# Patient Record
Sex: Female | Born: 1950 | Race: White | Hispanic: No | Marital: Married | Smoking: Never smoker
Health system: Southern US, Community
[De-identification: ages and names within clinical notes are randomized; demographics above are authoritative.]

## PROBLEM LIST (undated history)

## (undated) DIAGNOSIS — Q600 Renal agenesis, unilateral: Secondary | ICD-10-CM

## (undated) DIAGNOSIS — E119 Type 2 diabetes mellitus without complications: Secondary | ICD-10-CM

## (undated) DIAGNOSIS — Z8669 Personal history of other diseases of the nervous system and sense organs: Secondary | ICD-10-CM

## (undated) DIAGNOSIS — M1990 Unspecified osteoarthritis, unspecified site: Secondary | ICD-10-CM

## (undated) DIAGNOSIS — I10 Essential (primary) hypertension: Secondary | ICD-10-CM

## (undated) DIAGNOSIS — IMO0002 Reserved for concepts with insufficient information to code with codable children: Secondary | ICD-10-CM

## (undated) DIAGNOSIS — B372 Candidiasis of skin and nail: Secondary | ICD-10-CM

## (undated) DIAGNOSIS — K589 Irritable bowel syndrome without diarrhea: Secondary | ICD-10-CM

## (undated) DIAGNOSIS — K219 Gastro-esophageal reflux disease without esophagitis: Secondary | ICD-10-CM

## (undated) DIAGNOSIS — E785 Hyperlipidemia, unspecified: Secondary | ICD-10-CM

## (undated) DIAGNOSIS — Z860100 Personal history of colon polyps, unspecified: Secondary | ICD-10-CM

## (undated) HISTORY — DX: Unspecified osteoarthritis, unspecified site: M19.90

## (undated) HISTORY — PX: ABDOMINAL HYSTERECTOMY: SHX81

## (undated) HISTORY — DX: Candidiasis of skin and nail: B37.2

## (undated) HISTORY — DX: Gastro-esophageal reflux disease without esophagitis: K21.9

## (undated) HISTORY — DX: Irritable bowel syndrome, unspecified: K58.9

## (undated) HISTORY — DX: Type 2 diabetes mellitus without complications: E11.9

## (undated) HISTORY — DX: Renal agenesis, unilateral: Q60.0

## (undated) HISTORY — DX: Personal history of other diseases of the nervous system and sense organs: Z86.69

## (undated) HISTORY — DX: Hyperlipidemia, unspecified: E78.5

## (undated) HISTORY — DX: Essential (primary) hypertension: I10

## (undated) HISTORY — PX: NASAL SINUS SURGERY: SHX719

## (undated) HISTORY — DX: Personal history of colonic polyps: Z86.010

## (undated) HISTORY — PX: SALPINGOOPHORECTOMY: SHX82

## (undated) HISTORY — DX: Reserved for concepts with insufficient information to code with codable children: IMO0002

## (undated) HISTORY — DX: Personal history of colon polyps, unspecified: Z86.0100

---

## 2004-09-24 LAB — HM COLONOSCOPY

## 2010-09-28 LAB — HM PAP SMEAR

## 2011-03-07 LAB — HM MAMMOGRAPHY

## 2012-09-11 ENCOUNTER — Telehealth: Payer: Self-pay | Admitting: Family Medicine

## 2012-09-26 ENCOUNTER — Other Ambulatory Visit: Payer: Self-pay

## 2012-10-02 ENCOUNTER — Ambulatory Visit: Payer: Self-pay | Admitting: Family Medicine

## 2012-10-04 ENCOUNTER — Encounter: Payer: Self-pay | Admitting: Family Medicine

## 2012-10-04 ENCOUNTER — Ambulatory Visit (HOSPITAL_BASED_OUTPATIENT_CLINIC_OR_DEPARTMENT_OTHER)
Admission: RE | Admit: 2012-10-04 | Discharge: 2012-10-04 | Disposition: A | Discharge status: Home / Self Care | Payer: Medicare Other | Source: Ambulatory Visit | Attending: Family Medicine | Admitting: Family Medicine

## 2012-10-04 ENCOUNTER — Ambulatory Visit (INDEPENDENT_AMBULATORY_CARE_PROVIDER_SITE_OTHER): Payer: Medicare Other | Admitting: Family Medicine

## 2012-10-04 VITALS — BP 101/68 | HR 67 | Resp 16 | Ht 62.0 in | Wt 198.0 lb

## 2012-10-04 DIAGNOSIS — R05 Cough: Secondary | ICD-10-CM

## 2012-10-04 DIAGNOSIS — R0603 Acute respiratory distress: Secondary | ICD-10-CM

## 2012-10-04 DIAGNOSIS — R062 Wheezing: Secondary | ICD-10-CM

## 2012-10-04 DIAGNOSIS — R059 Cough, unspecified: Secondary | ICD-10-CM

## 2012-10-04 DIAGNOSIS — R0989 Other specified symptoms and signs involving the circulatory and respiratory systems: Secondary | ICD-10-CM | POA: Insufficient documentation

## 2012-10-04 DIAGNOSIS — R5081 Fever presenting with conditions classified elsewhere: Secondary | ICD-10-CM

## 2012-10-04 DIAGNOSIS — R509 Fever, unspecified: Secondary | ICD-10-CM | POA: Insufficient documentation

## 2012-10-04 MED ORDER — BENZONATATE 200 MG PO CAPS: 200.0000 mg | ORAL_CAPSULE | Freq: Three times a day (TID) | ORAL | Status: DC | PRN

## 2012-10-04 MED ORDER — ALBUTEROL SULFATE HFA 108 (90 BASE) MCG/ACT IN AERS: 2.0000 | INHALATION_SPRAY | Freq: Four times a day (QID) | RESPIRATORY_TRACT | Status: DC | PRN

## 2012-10-04 MED ORDER — HYDROCODONE-HOMATROPINE 5-1.5 MG/5ML PO SYRP: ORAL_SOLUTION | ORAL | Status: DC

## 2012-10-04 MED ORDER — AZITHROMYCIN 500 MG PO TABS: 500.0000 mg | ORAL_TABLET | Freq: Every day | ORAL | Status: AC

## 2012-10-04 NOTE — Progress Notes (Signed)
  Subjective:    Patient ID: Adrienne Mcconnell, female    DOB: May 14, 1951, 62 y.o.   MRN: [id number]  HPI:    Adrienne Mcconnell presents today for:   URI  This is a recurrent problem. The current episode started more than 1 month ago. The problem has been gradually worsening. The maximum temperature recorded prior to her arrival was 100 - 100.9 F. Associated symptoms include congestion, coughing, ear pain, headaches and joint pain. Associated symptoms comments: Difficulty breathing. She has tried decongestant for the symptoms. The treatment provided no relief.   Review of Systems  HENT: Positive for ear pain and congestion.   Respiratory: Positive for cough.   Musculoskeletal: Positive for joint pain.  Neurological: Positive for headaches.      Objective:   Physical Exam  Constitutional: She appears well-nourished. No distress.  Cardiovascular: Normal rate, regular rhythm and normal heart sounds.   Pulmonary/Chest: Effort normal. No respiratory distress. She has wheezes. She exhibits tenderness.  Abdominal: She exhibits no mass. There is tenderness. There is no rebound, no guarding and no CVA tenderness.  Neurological: She is alert.  Skin: Skin is warm and dry. No rash noted.  Psychiatric: She has a normal mood and affect.      Assessment & Plan:   1)  Upper Respiratory Infection - Her CXR was normal.  Since she has been sick for a month without much improvement and still has some elevation of her temperature, she was given a prescription for Zithromax to take for 3 days.  2)  Wheezing - Her breathing improved after she received a breathing treatment so she was given a prescription for an albuterol inhaler.    3)  Cough - She was given medications for her cough.

## 2012-10-04 NOTE — Patient Instructions (Addendum)
1)  Take Zithromax once a day for 3 days  2)  Delsym 2 tsp 2 times per day or Mucinex DM 1200/60 twice a day for cough plus add the Tessalon Perles and the Hycodan at night.    3)  Nasal Congestion - Noral AD   4)  Chest Congestion/Tightness - Use the inhaler as directed.     Bronchitis Bronchitis is the body's way of reacting to injury and/or infection (inflammation) of the bronchi. Bronchi are the air tubes that extend from the windpipe into the lungs. If the inflammation becomes severe, it may cause shortness of breath. CAUSES  Inflammation may be caused by:  A virus.  Germs (bacteria).  Dust.  Allergens.  Pollutants and many other irritants. The cells lining the bronchial tree are covered with tiny hairs (cilia). These constantly beat upward, away from the lungs, toward the mouth. This keeps the lungs free of pollutants. When these cells become too irritated and are unable to do their job, mucus begins to develop. This causes the characteristic cough of bronchitis. The cough clears the lungs when the cilia are unable to do their job. Without either of these protective mechanisms, the mucus would settle in the lungs. Then you would develop pneumonia. Smoking is a common cause of bronchitis and can contribute to pneumonia. Stopping this habit is the single most important thing you can do to help yourself. TREATMENT   Your caregiver may prescribe an antibiotic if the cough is caused by bacteria. Also, medicines that open up your airways make it easier to breathe. Your caregiver may also recommend or prescribe an expectorant. It will loosen the mucus to be coughed up. Only take over-the-counter or prescription medicines for pain, discomfort, or fever as directed by your caregiver.  Removing whatever causes the problem (smoking, for example) is critical to preventing the problem from getting worse.  Cough suppressants may be prescribed for relief of cough symptoms.  Inhaled medicines  may be prescribed to help with symptoms now and to help prevent problems from returning.  For those with recurrent (chronic) bronchitis, there may be a need for steroid medicines. SEEK IMMEDIATE MEDICAL CARE IF:   During treatment, you develop more pus-like mucus (purulent sputum).  You have a fever.  Your baby is older than 3 months with a rectal temperature of 102 F (38.9 C) or higher.  Your baby is 19 months old or younger with a rectal temperature of 100.4 F (38 C) or higher.  You become progressively more ill.  You have increased difficulty breathing, wheezing, or shortness of breath. It is necessary to seek immediate medical care if you are elderly or sick from any other disease. MAKE SURE YOU:   Understand these instructions.  Will watch your condition.  Will get help right away if you are not doing well or get worse. Document Released: 05/23/2005 Document Revised: 08/15/2011 Document Reviewed: 04/01/2008 [facility] Patient Information 2013 [place], [place].

## 2012-10-05 ENCOUNTER — Encounter: Payer: Self-pay | Admitting: Family Medicine

## 2012-10-07 ENCOUNTER — Encounter: Payer: Self-pay | Admitting: Family Medicine

## 2012-10-07 DIAGNOSIS — R5081 Fever presenting with conditions classified elsewhere: Secondary | ICD-10-CM | POA: Insufficient documentation

## 2012-10-07 DIAGNOSIS — R062 Wheezing: Secondary | ICD-10-CM | POA: Insufficient documentation

## 2012-10-07 DIAGNOSIS — R059 Cough, unspecified: Secondary | ICD-10-CM | POA: Insufficient documentation

## 2012-10-07 DIAGNOSIS — R05 Cough: Secondary | ICD-10-CM | POA: Insufficient documentation

## 2012-10-07 NOTE — Addendum Note (Signed)
Addended by: Birdena Jubilee on: 10/07/2012 03:08 PM   Modules accepted: Orders

## 2012-10-08 ENCOUNTER — Other Ambulatory Visit: Payer: Self-pay | Admitting: Family Medicine

## 2012-11-09 ENCOUNTER — Other Ambulatory Visit: Payer: Medicare Other

## 2012-11-09 DIAGNOSIS — E1059 Type 1 diabetes mellitus with other circulatory complications: Secondary | ICD-10-CM

## 2012-11-09 DIAGNOSIS — E785 Hyperlipidemia, unspecified: Secondary | ICD-10-CM

## 2012-11-09 DIAGNOSIS — E039 Hypothyroidism, unspecified: Secondary | ICD-10-CM

## 2012-11-09 LAB — CBC WITH DIFFERENTIAL/PLATELET
Basophils Absolute: 0 10*3/uL (ref 0.0–0.1)
Basophils Relative: 1 % (ref 0–1)
Eosinophils Absolute: 0.1 10*3/uL (ref 0.0–0.7)
Eosinophils Relative: 1 % (ref 0–5)
HCT: 38.3 % (ref 36.0–46.0)
Hemoglobin: 13 g/dL (ref 12.0–15.0)
Lymphocytes Relative: 37 % (ref 12–46)
Lymphs Abs: 2.4 10*3/uL (ref 0.7–4.0)
MCH: 32 pg (ref 26.0–34.0)
MCHC: 33.9 g/dL (ref 30.0–36.0)
MCV: 94.3 fL (ref 78.0–100.0)
Monocytes Absolute: 0.6 10*3/uL (ref 0.1–1.0)
Monocytes Relative: 10 % (ref 3–12)
Neutro Abs: 3.3 10*3/uL (ref 1.7–7.7)
Neutrophils Relative %: 51 % (ref 43–77)
Platelets: 258 10*3/uL (ref 150–400)
RBC: 4.06 MIL/uL (ref 3.87–5.11)
RDW: 14.5 % (ref 11.5–15.5)
WBC: 6.5 10*3/uL (ref 4.0–10.5)

## 2012-11-09 LAB — COMPLETE METABOLIC PANEL WITH GFR
ALT: 15 U/L (ref 0–35)
AST: 14 U/L (ref 0–37)
Albumin: 4.3 g/dL (ref 3.5–5.2)
Alkaline Phosphatase: 91 U/L (ref 39–117)
BUN: 13 mg/dL (ref 6–23)
CO2: 28 mEq/L (ref 19–32)
Calcium: 9.7 mg/dL (ref 8.4–10.5)
Chloride: 104 mEq/L (ref 96–112)
Creat: 0.94 mg/dL (ref 0.50–1.10)
GFR, Est African American: 77 mL/min
GFR, Est Non African American: 67 mL/min
Glucose, Bld: 123 mg/dL — ABNORMAL HIGH (ref 70–99)
Potassium: 5 mEq/L (ref 3.5–5.3)
Sodium: 138 mEq/L (ref 135–145)
Total Bilirubin: 0.4 mg/dL (ref 0.3–1.2)
Total Protein: 6.9 g/dL (ref 6.0–8.3)

## 2012-11-09 LAB — LIPID PANEL
Cholesterol: 214 mg/dL — ABNORMAL HIGH (ref 0–200)
HDL: 74 mg/dL (ref >39)
LDL Cholesterol: 107 mg/dL — ABNORMAL HIGH (ref 0–99)
Total CHOL/HDL Ratio: 2.9 Ratio
Triglycerides: 165 mg/dL — ABNORMAL HIGH (ref <150)
VLDL: 33 mg/dL (ref 0–40)

## 2012-11-09 LAB — HEMOGLOBIN A1C
Hgb A1c MFr Bld: 6.1 % — ABNORMAL HIGH (ref <5.7)
Mean Plasma Glucose: 128 mg/dL — ABNORMAL HIGH (ref <117)

## 2012-11-09 LAB — TSH: TSH: 1.002 u[IU]/mL (ref 0.350–4.500)

## 2012-11-09 LAB — T4, FREE: Free T4: 1.03 ng/dL (ref 0.80–1.80)

## 2012-11-16 ENCOUNTER — Encounter: Payer: Self-pay | Admitting: Family Medicine

## 2012-11-16 ENCOUNTER — Ambulatory Visit (INDEPENDENT_AMBULATORY_CARE_PROVIDER_SITE_OTHER): Payer: Medicare Other | Admitting: Family Medicine

## 2012-11-16 VITALS — BP 96/67 | HR 64 | Wt 198.0 lb

## 2012-11-16 DIAGNOSIS — E039 Hypothyroidism, unspecified: Secondary | ICD-10-CM

## 2012-11-16 DIAGNOSIS — E785 Hyperlipidemia, unspecified: Secondary | ICD-10-CM

## 2012-11-16 DIAGNOSIS — IMO0001 Reserved for inherently not codable concepts without codable children: Secondary | ICD-10-CM

## 2012-11-16 DIAGNOSIS — I10 Essential (primary) hypertension: Secondary | ICD-10-CM

## 2012-11-16 DIAGNOSIS — K219 Gastro-esophageal reflux disease without esophagitis: Secondary | ICD-10-CM

## 2012-11-16 MED ORDER — PRAVASTATIN SODIUM 40 MG PO TABS: 40.0000 mg | ORAL_TABLET | Freq: Every day | ORAL | Status: DC

## 2012-11-16 MED ORDER — SITAGLIPTIN PHOSPHATE 100 MG PO TABS: 100.0000 mg | ORAL_TABLET | Freq: Every day | ORAL | Status: DC

## 2012-11-16 MED ORDER — PANTOPRAZOLE SODIUM 40 MG PO TBEC: 40.0000 mg | DELAYED_RELEASE_TABLET | Freq: Every day | ORAL | Status: AC

## 2012-11-16 MED ORDER — MONTELUKAST SODIUM 10 MG PO TABS: 10.0000 mg | ORAL_TABLET | Freq: Every day | ORAL | Status: DC

## 2012-11-16 MED ORDER — NATEGLINIDE 120 MG PO TABS: 120.0000 mg | ORAL_TABLET | Freq: Three times a day (TID) | ORAL | Status: DC

## 2012-11-16 MED ORDER — LEVOTHYROXINE SODIUM 137 MCG PO TABS: 137.0000 ug | ORAL_TABLET | Freq: Every day | ORAL | Status: DC

## 2012-11-16 MED ORDER — CANAGLIFLOZIN 300 MG PO TABS: 1.0000 | ORAL_TABLET | Freq: Every day | ORAL | Status: DC

## 2012-11-16 MED ORDER — NADOLOL 40 MG PO TABS: ORAL_TABLET | ORAL | Status: DC

## 2012-11-16 NOTE — Progress Notes (Signed)
  Subjective:    Patient ID: Adrienne Mcconnell, female    DOB: 12-21-1950, 62 y.o.   MRN: [id number]  HPI  Adrienne Mcconnell is here today to go over her most recent lab results, discuss the following conditions and to have several of her medications refilled.   1)  Type II DM:  Her sugars are controlled on the combination of Invokana, Januvia and Starlix  2)  GERD:  Her GERD is controlled on Protonix.  3)  Hypothyroid:  Her energy level is low but this is not unusual for her    4)  Hypertension:  Her BP is low on her current medication  Review of Systems  Constitutional: Positive for fatigue. Negative for activity change, appetite change and unexpected weight change.  HENT: Negative.   Eyes: Negative.   Respiratory: Negative for chest tightness and shortness of breath.   Cardiovascular: Negative for chest pain, palpitations and leg swelling.  Gastrointestinal: Negative for diarrhea and constipation.  Endocrine: Negative.  Negative for polydipsia, polyphagia and polyuria.  Genitourinary: Negative for urgency, frequency and difficulty urinating.  Musculoskeletal: Positive for myalgias and arthralgias.  Skin: Negative.   Neurological: Negative.  Negative for light-headedness and numbness.  Hematological: Negative for adenopathy. Does not bruise/bleed easily.  Psychiatric/Behavioral: Negative for sleep disturbance and dysphoric mood. The patient is not nervous/anxious.    Past Medical History  Diagnosis Date  . Hyperlipidemia   . Hypertension   . GERD (gastroesophageal reflux disease)   . Osteoarthritis     Neck  . IBS (irritable bowel syndrome)   . History of migraine headaches   . Diabetes mellitus without complication   . History of colon polyps   . Solitary kidney   . Osteoarthritis   . Candidiasis of skin and nail    Family History  Problem Relation Age of Onset  . Diabetes Mother   . Heart disease Mother   . Diabetes Father   . Stroke Father   . Diabetes Maternal Aunt   .  Diabetes Maternal Uncle   . CVA Maternal Uncle   . Diabetes Paternal Aunt   . Diabetes Paternal Uncle   . Heart disease Maternal Grandmother   . Cancer Cousin     Breast and lung cancer   History   Social History Narrative   Marital Status:  Widowed   Children:  Daughter Psychologist, counselling)    Pets: Dogs (7)    Living Situation: Lives alone    Occupation: Disabled   Education:  GED   Tobacco Use/Exposure:  None    Alcohol Use:  None    Drug Use:  None   Diet:  Regular   Exercise:  Walking    Hobbies: Reading, Animals, Photography                Objective:   Physical Exam  Constitutional: She appears well-nourished. No distress.  HENT:  Head: Normocephalic.  Eyes: No scleral icterus.  Neck: No thyromegaly present.  Cardiovascular: Normal rate, regular rhythm and normal heart sounds.   Pulmonary/Chest: Effort normal and breath sounds normal.  Abdominal: There is no tenderness.  Musculoskeletal: She exhibits no edema and no tenderness.  Neurological: She is alert.  Skin: Skin is warm and dry.  Psychiatric: She has a normal mood and affect. Her behavior is normal. Judgment and thought content normal.       Assessment & Plan:

## 2012-11-22 ENCOUNTER — Ambulatory Visit: Payer: Medicare Other | Admitting: Family Medicine

## 2012-11-22 DIAGNOSIS — Z0289 Encounter for other administrative examinations: Secondary | ICD-10-CM

## 2012-12-14 DIAGNOSIS — E039 Hypothyroidism, unspecified: Secondary | ICD-10-CM | POA: Insufficient documentation

## 2012-12-14 DIAGNOSIS — IMO0001 Reserved for inherently not codable concepts without codable children: Secondary | ICD-10-CM | POA: Insufficient documentation

## 2012-12-14 DIAGNOSIS — K219 Gastro-esophageal reflux disease without esophagitis: Secondary | ICD-10-CM | POA: Insufficient documentation

## 2012-12-14 DIAGNOSIS — I10 Essential (primary) hypertension: Secondary | ICD-10-CM | POA: Insufficient documentation

## 2012-12-14 DIAGNOSIS — E785 Hyperlipidemia, unspecified: Secondary | ICD-10-CM | POA: Insufficient documentation

## 2012-12-14 NOTE — Assessment & Plan Note (Signed)
Her BP is low on the combination of losartan and nadolol. She is to hold the losartan for now.

## 2012-12-16 NOTE — Assessment & Plan Note (Signed)
Refilled her Invokana, Januvia and Starlix

## 2012-12-16 NOTE — Assessment & Plan Note (Signed)
Refilled her Pravastatin

## 2012-12-16 NOTE — Assessment & Plan Note (Signed)
Refilled her levothyroid 137 mcg.

## 2012-12-16 NOTE — Assessment & Plan Note (Signed)
Refilled her Protonix.   

## 2013-01-23 ENCOUNTER — Ambulatory Visit (INDEPENDENT_AMBULATORY_CARE_PROVIDER_SITE_OTHER): Payer: Medicare Other | Admitting: Family Medicine

## 2013-01-23 ENCOUNTER — Encounter: Payer: Self-pay | Admitting: Family Medicine

## 2013-01-23 VITALS — BP 128/82 | HR 76 | Resp 16 | Wt 198.0 lb

## 2013-01-23 DIAGNOSIS — E785 Hyperlipidemia, unspecified: Secondary | ICD-10-CM

## 2013-01-23 DIAGNOSIS — Z23 Encounter for immunization: Secondary | ICD-10-CM

## 2013-01-23 DIAGNOSIS — B372 Candidiasis of skin and nail: Secondary | ICD-10-CM

## 2013-01-23 DIAGNOSIS — M1990 Unspecified osteoarthritis, unspecified site: Secondary | ICD-10-CM

## 2013-01-23 DIAGNOSIS — K219 Gastro-esophageal reflux disease without esophagitis: Secondary | ICD-10-CM

## 2013-01-23 MED ORDER — COLESEVELAM HCL 625 MG PO TABS: 1875.0000 mg | ORAL_TABLET | Freq: Two times a day (BID) | ORAL | Status: DC

## 2013-01-23 MED ORDER — DICLOFENAC SODIUM 1 % TD GEL: 4.0000 g | Freq: Four times a day (QID) | TRANSDERMAL | Status: DC

## 2013-01-23 MED ORDER — NYSTATIN 100000 UNIT/GM EX POWD: CUTANEOUS | Status: DC

## 2013-01-23 MED ORDER — CELECOXIB 200 MG PO CAPS: 200.0000 mg | ORAL_CAPSULE | Freq: Every day | ORAL | Status: DC

## 2013-01-23 MED ORDER — EZETIMIBE 10 MG PO TABS: 10.0000 mg | ORAL_TABLET | Freq: Every day | ORAL | Status: DC

## 2013-01-23 NOTE — Progress Notes (Signed)
  Subjective:    Patient ID: Adrienne Mcconnell, female    DOB: November 05, 1950, 62 y.o.   MRN: [id number]  HPI  Adrienne Mcconnell is here today to discuss the conditions listed below.  1)  Hyperlipidemia:  She is doing well on the combination of pravastatin, Zetia and Welchol.  Today she needs her Zetia and Welchol refilled.   2)  GERD:   Her acid reflux is well controlled with her GI Cocktail and Protonix and she needs both refilled.   3)  Generalized Pain:  She needs a refill on her Voltaren Gel and Celebrex.   4)  Skin Eruption:  She has done well with her nystatin cream and needs a refill on it.    Review of Systems  Constitutional: Positive for fatigue.  HENT: Negative.   Cardiovascular: Negative.   Musculoskeletal: Positive for myalgias and arthralgias.     Past Medical History  Diagnosis Date  . Hyperlipidemia   . Hypertension   . GERD (gastroesophageal reflux disease)   . Osteoarthritis     Neck  . IBS (irritable bowel syndrome)   . History of migraine headaches   . Diabetes mellitus without complication   . History of colon polyps   . Solitary kidney   . Osteoarthritis   . Candidiasis of skin and nail      Family History  Problem Relation Age of Onset  . Diabetes Mother   . Heart disease Mother   . Diabetes Father   . Stroke Father   . Diabetes Maternal Aunt   . Diabetes Maternal Uncle   . CVA Maternal Uncle   . Diabetes Paternal Aunt   . Diabetes Paternal Uncle   . Heart disease Maternal Grandmother   . Cancer Cousin     Breast and lung cancer     History   Social History Narrative   Marital Status:  Widowed   Children:  Daughter Psychologist, counselling)    Pets: Dogs (7)    Living Situation: Lives alone    Occupation: Disabled   Education:  GED   Tobacco Use/Exposure:  None    Alcohol Use:  None    Drug Use:  None   Diet:  Regular   Exercise:  Walking    Hobbies: Reading, Animals, Photography                Objective:   Physical Exam  Constitutional: She is  oriented to person, place, and time. She appears well-developed.  Cardiovascular: Normal rate and regular rhythm.   Pulmonary/Chest: Effort normal and breath sounds normal.  Abdominal: Soft. Bowel sounds are normal.  Neurological: She is alert and oriented to person, place, and time.  Skin: Skin is warm and dry.  Psychiatric: She has a normal mood and affect.      Assessment & Plan:

## 2013-02-27 ENCOUNTER — Other Ambulatory Visit: Payer: Self-pay | Admitting: *Deleted

## 2013-02-27 DIAGNOSIS — I10 Essential (primary) hypertension: Secondary | ICD-10-CM

## 2013-02-27 DIAGNOSIS — E785 Hyperlipidemia, unspecified: Secondary | ICD-10-CM

## 2013-02-27 DIAGNOSIS — E039 Hypothyroidism, unspecified: Secondary | ICD-10-CM

## 2013-02-28 ENCOUNTER — Other Ambulatory Visit: Payer: Medicare Other

## 2013-02-28 LAB — COMPLETE METABOLIC PANEL WITH GFR
ALT: 14 U/L (ref 0–35)
AST: 16 U/L (ref 0–37)
Albumin: 4.1 g/dL (ref 3.5–5.2)
Alkaline Phosphatase: 90 U/L (ref 39–117)
BUN: 12 mg/dL (ref 6–23)
CO2: 27 mEq/L (ref 19–32)
Calcium: 9.7 mg/dL (ref 8.4–10.5)
Chloride: 106 mEq/L (ref 96–112)
Creat: 0.89 mg/dL (ref 0.50–1.10)
GFR, Est African American: 82 mL/min
GFR, Est Non African American: 71 mL/min
Glucose, Bld: 112 mg/dL — ABNORMAL HIGH (ref 70–99)
Potassium: 4.8 mEq/L (ref 3.5–5.3)
Sodium: 141 mEq/L (ref 135–145)
Total Bilirubin: 0.4 mg/dL (ref 0.3–1.2)
Total Protein: 6.9 g/dL (ref 6.0–8.3)

## 2013-02-28 LAB — T3, FREE: T3, Free: 3.1 pg/mL (ref 2.3–4.2)

## 2013-02-28 LAB — T4, FREE: Free T4: 1.38 ng/dL (ref 0.80–1.80)

## 2013-02-28 LAB — TSH: TSH: 0.061 u[IU]/mL — ABNORMAL LOW (ref 0.350–4.500)

## 2013-02-28 LAB — LIPID PANEL
Cholesterol: 228 mg/dL — ABNORMAL HIGH (ref 0–200)
HDL: 75 mg/dL (ref >39)
LDL Cholesterol: 120 mg/dL — ABNORMAL HIGH (ref 0–99)
Total CHOL/HDL Ratio: 3 Ratio
Triglycerides: 165 mg/dL — ABNORMAL HIGH (ref <150)
VLDL: 33 mg/dL (ref 0–40)

## 2013-03-05 LAB — T3, REVERSE: T3, Reverse: 19 ng/dL (ref 8–25)

## 2013-03-07 ENCOUNTER — Encounter: Payer: Self-pay | Admitting: Family Medicine

## 2013-03-07 ENCOUNTER — Ambulatory Visit (INDEPENDENT_AMBULATORY_CARE_PROVIDER_SITE_OTHER): Payer: Medicare Other | Admitting: Family Medicine

## 2013-03-07 VITALS — BP 148/90 | HR 64 | Resp 16 | Ht 62.0 in | Wt 192.0 lb

## 2013-03-07 DIAGNOSIS — I10 Essential (primary) hypertension: Secondary | ICD-10-CM

## 2013-03-07 DIAGNOSIS — E785 Hyperlipidemia, unspecified: Secondary | ICD-10-CM

## 2013-03-07 DIAGNOSIS — B009 Herpesviral infection, unspecified: Secondary | ICD-10-CM

## 2013-03-07 DIAGNOSIS — IMO0001 Reserved for inherently not codable concepts without codable children: Secondary | ICD-10-CM

## 2013-03-07 DIAGNOSIS — R110 Nausea: Secondary | ICD-10-CM

## 2013-03-07 LAB — POCT GLYCOSYLATED HEMOGLOBIN (HGB A1C): Hemoglobin A1C: 6

## 2013-03-07 MED ORDER — PROMETHAZINE HCL 25 MG PO TABS: 25.0000 mg | ORAL_TABLET | Freq: Three times a day (TID) | ORAL | Status: DC | PRN

## 2013-03-07 MED ORDER — SITAGLIPTIN PHOSPHATE 100 MG PO TABS: 100.0000 mg | ORAL_TABLET | Freq: Every day | ORAL | Status: DC

## 2013-03-07 MED ORDER — LOSARTAN POTASSIUM 50 MG PO TABS: 50.0000 mg | ORAL_TABLET | Freq: Every day | ORAL | Status: DC

## 2013-03-07 MED ORDER — NADOLOL 40 MG PO TABS: ORAL_TABLET | ORAL | Status: DC

## 2013-03-07 MED ORDER — VALACYCLOVIR HCL 1 G PO TABS: 1000.0000 mg | ORAL_TABLET | Freq: Every day | ORAL | Status: DC

## 2013-03-07 MED ORDER — PRAVASTATIN SODIUM 40 MG PO TABS: 40.0000 mg | ORAL_TABLET | Freq: Every day | ORAL | Status: DC

## 2013-03-07 MED ORDER — CANAGLIFLOZIN 300 MG PO TABS: 1.0000 | ORAL_TABLET | Freq: Every day | ORAL | Status: DC

## 2013-03-07 NOTE — Progress Notes (Signed)
  Subjective:    Patient ID: Adrienne Mcconnell, female    DOB: June 30, 1950, 62 y.o.   MRN: [id number]  HPI  Adrienne Mcconnell is here today to go over her most recent lab results and to get some of her medications refilled.   1)  Type II DM:  She continues taking Invokana 300 mg, Januvia 100 mg, and Starlix 120 mg. She needs these medications refilled.   2)  Hypertension:  She is currently taking nadolol 20 mg (1/2 pill 40 mg). Her blood pressure is well controlled on it and she needs a refill.   3)  Hyperlipidemia:  She is taking a combination is on pravastatin 40 mg, Welchol, 625 mg and Zetia 10 mg.  She needs a refill today only on her pravastatin.    Review of Systems  Constitutional: Negative.   HENT: Negative.   Eyes: Negative.   Respiratory: Negative.   Cardiovascular: Negative.   Gastrointestinal: Negative.   Endocrine: Negative.   Genitourinary: Negative.   Musculoskeletal: Negative.   Skin: Negative.   Allergic/Immunologic: Negative.   Neurological: Negative.   Hematological: Negative.   Psychiatric/Behavioral: Negative.     Past Medical History  Diagnosis Date  . Hyperlipidemia   . Hypertension   . GERD (gastroesophageal reflux disease)   . Osteoarthritis     Neck  . IBS (irritable bowel syndrome)   . History of migraine headaches   . Diabetes mellitus without complication   . History of colon polyps   . Solitary kidney   . Osteoarthritis   . Candidiasis of skin and nail     Family History  Problem Relation Age of Onset  . Diabetes Mother   . Heart disease Mother   . Diabetes Father   . Stroke Father   . Diabetes Maternal Aunt   . Diabetes Maternal Uncle   . CVA Maternal Uncle   . Diabetes Paternal Aunt   . Diabetes Paternal Uncle   . Heart disease Maternal Grandmother   . Cancer Cousin     Breast and lung cancer    History   Social History Narrative   Marital Status:  Widowed   Children:  Daughter Psychologist, counselling)    Pets: Dogs (7)    Living Situation: Lives  alone    Occupation: Disabled   Education:  GED   Tobacco Use/Exposure:  None    Alcohol Use:  None    Drug Use:  None   Diet:  Regular   Exercise:  Walking    Hobbies: Reading, Animals, Photography                Objective:   Physical Exam  Vitals reviewed. Constitutional: She is oriented to person, place, and time. She appears well-developed and well-nourished.  Cardiovascular: Normal rate and regular rhythm.   Neurological: She is alert and oriented to person, place, and time.  Skin: Skin is warm and dry.  Psychiatric: She has a normal mood and affect.      Assessment & Plan:

## 2013-03-07 NOTE — Patient Instructions (Addendum)
1)  Blood Sugar - Your A1c is great at 6.0%.  Let's try a new medication to see if we can keep your sugars good and help you lose weight.  Decrease your Starlix to 1 per day with biggest meal.  We're going to add Tanzeum once this month then twice next month and finally we'll see if we can do it weekly.    2)  Choleseterol - Increase the Pravachol to 40 mg.

## 2013-03-08 ENCOUNTER — Encounter: Payer: Self-pay | Admitting: Family Medicine

## 2013-03-09 DIAGNOSIS — B009 Herpesviral infection, unspecified: Secondary | ICD-10-CM | POA: Insufficient documentation

## 2013-03-09 DIAGNOSIS — R110 Nausea: Secondary | ICD-10-CM | POA: Insufficient documentation

## 2013-03-09 NOTE — Assessment & Plan Note (Signed)
She is to work harder on her diet and exercise.   

## 2013-03-09 NOTE — Assessment & Plan Note (Signed)
Refilled her Phenergan.

## 2013-03-09 NOTE — Assessment & Plan Note (Addendum)
Refilled her nadolol and losartan.

## 2013-03-09 NOTE — Assessment & Plan Note (Signed)
She has not been able to tolerate Byetta, Bydureon or Victoza.  We'll see how she tolerates Tanzeum.  She is going to decrease her Starlix to just one pill per day (biggest meal).  We'll start her very slowly - 1 injection this month and increase by 1 shot every month.  She is to F/U in one month.  Her A1c is great.

## 2013-03-09 NOTE — Assessment & Plan Note (Signed)
Refilled her Valtrex 

## 2013-03-17 DIAGNOSIS — B372 Candidiasis of skin and nail: Secondary | ICD-10-CM | POA: Insufficient documentation

## 2013-03-17 DIAGNOSIS — Z23 Encounter for immunization: Secondary | ICD-10-CM | POA: Insufficient documentation

## 2013-03-17 DIAGNOSIS — M1990 Unspecified osteoarthritis, unspecified site: Secondary | ICD-10-CM | POA: Insufficient documentation

## 2013-03-17 NOTE — Assessment & Plan Note (Signed)
Refilled her nystatin cream.

## 2013-03-17 NOTE — Assessment & Plan Note (Signed)
Refilled her Zetia, pravastatin and Welchol.

## 2013-03-17 NOTE — Assessment & Plan Note (Signed)
She was given prescriptions for Celebrex and Diclofenac.  She'll see which one her insurance will cover.

## 2013-03-17 NOTE — Assessment & Plan Note (Signed)
Refilled her Protonix.

## 2013-03-18 ENCOUNTER — Other Ambulatory Visit: Payer: Self-pay | Admitting: Family Medicine

## 2013-03-21 ENCOUNTER — Other Ambulatory Visit: Payer: Self-pay | Admitting: Family Medicine

## 2013-04-08 ENCOUNTER — Ambulatory Visit: Payer: Medicare Other | Admitting: Family Medicine

## 2013-04-15 ENCOUNTER — Encounter: Payer: Self-pay | Admitting: Family Medicine

## 2013-04-15 ENCOUNTER — Ambulatory Visit (INDEPENDENT_AMBULATORY_CARE_PROVIDER_SITE_OTHER): Payer: Medicare Other | Admitting: Family Medicine

## 2013-04-15 VITALS — BP 120/75 | HR 69 | Resp 16 | Ht 62.0 in | Wt 200.0 lb

## 2013-04-15 DIAGNOSIS — IMO0001 Reserved for inherently not codable concepts without codable children: Secondary | ICD-10-CM | POA: Insufficient documentation

## 2013-04-15 DIAGNOSIS — E119 Type 2 diabetes mellitus without complications: Secondary | ICD-10-CM

## 2013-04-15 MED ORDER — DULOXETINE HCL 30 MG PO CPEP: ORAL_CAPSULE | ORAL | Status: DC

## 2013-04-15 MED ORDER — SITAGLIPTIN-METFORMIN HCL ER 100-1000 MG PO TB24
1.0000 | ORAL_TABLET | Freq: Every day | ORAL | Status: DC
Start: 2013-04-15 — End: 2013-05-20

## 2013-04-15 NOTE — Assessment & Plan Note (Addendum)
She was started on Tanzeum at her last visit. The goal was for her to lose weight hoping that this would help her pain.  Surprisingly, she gained 8 lbs instead of losing weight.  We had a discussion about the various medications she has tried.  She thinks that she has had reactions to metformin in the past but is willing to try it again. She was given samples of Janumet XR 100/1000 which she is to take at night and she'll take the Invokana in the am.  She is going to hold on the Starlix except for large meals like Thanksgiving.  We'll decide if this combination works for her.

## 2013-04-15 NOTE — Progress Notes (Signed)
  Subjective:    Patient ID: Adrienne Mcconnell, female    DOB: 1950/09/14, 62 y.o.   MRN: [id number]  HPI  Adrienne Mcconnell is here today to follow up on the Tanzeum. She tolerated the injection well. She has been checking her sugars at home some and they have been running pretty good.  She has been recently sick with URI symptoms and has been having increased muscle pain.     Review of Systems  Constitutional: Negative.   HENT: Negative.   Eyes: Negative.   Respiratory: Negative.   Cardiovascular: Negative.   Gastrointestinal: Negative.   Endocrine: Negative for cold intolerance, heat intolerance, polydipsia, polyphagia and polyuria.  Musculoskeletal: Negative.   Skin: Negative.   Allergic/Immunologic: Negative.   Neurological: Negative.   Hematological: Negative.   Psychiatric/Behavioral: Negative.      Past Medical History  Diagnosis Date  . Hyperlipidemia   . Hypertension   . GERD (gastroesophageal reflux disease)   . Osteoarthritis     Neck  . IBS (irritable bowel syndrome)   . History of migraine headaches   . Diabetes mellitus without complication   . History of colon polyps   . Solitary kidney   . Osteoarthritis   . Candidiasis of skin and nail      Family History  Problem Relation Age of Onset  . Diabetes Mother   . Heart disease Mother   . Diabetes Father   . Stroke Father   . Diabetes Maternal Aunt   . Diabetes Maternal Uncle   . CVA Maternal Uncle   . Diabetes Paternal Aunt   . Diabetes Paternal Uncle   . Heart disease Maternal Grandmother   . Cancer Cousin     Breast and lung cancer    History   Social History Narrative   Marital Status:  Widowed   Children:  Daughter Psychologist, counselling)    Pets: Dogs (7)    Living Situation: Lives alone    Occupation: Disabled   Education:  GED   Tobacco Use/Exposure:  None    Alcohol Use:  None    Drug Use:  None   Diet:  Regular   Exercise:  Walking    Hobbies: Reading, Animals, Photography                Objective:    Physical Exam  Nursing note and vitals reviewed. Constitutional: She is oriented to person, place, and time. She appears well-developed and well-nourished.  HENT:  Head: Normocephalic.  Eyes: Pupils are equal, round, and reactive to light.  Neck: Normal range of motion.  Cardiovascular: Normal rate.   Pulmonary/Chest: Effort normal.  Abdominal: Soft.  Musculoskeletal: Normal range of motion.  Neurological: She is alert and oriented to person, place, and time.  Skin: Skin is warm and dry.  Psychiatric: She has a normal mood and affect. Her behavior is normal. Judgment and thought content normal.      Assessment & Plan:

## 2013-04-15 NOTE — Patient Instructions (Addendum)
1)  Blood Sugar - Let's try 1 Janumet XR 100/1000 at bedtime and take the Invokana 300 mg in the morning. Hold the Starlix although you might take one every now and then before you eat a really big meal.  Be sure and hold the Januvia since it is in the Janumet.    2)  Mood/Pain - Try the Cymbalta 30 mg for 1 week then increase to 60 mg.  If you feel a little nauseated then take some Phenergan.  Don't start the Cymbalta until you have been on the Janumet XR for 1-2 weeks.   Once you are feeling better then try to get back on your exercise.       Diabetes and Exercise Exercising regularly is important. It is not just about losing weight. It has many health benefits, such as:  Improving your overall fitness, flexibility, and endurance.  Increasing your bone density.  Helping with weight control.  Decreasing your body fat.  Increasing your muscle strength.  Reducing stress and tension.  Improving your overall health. People with diabetes who exercise gain additional benefits because exercise:  Reduces appetite.  Improves the body's use of blood sugar (glucose).  Helps lower or control blood glucose.  Decreases blood pressure.  Helps control blood lipids (such as cholesterol and triglycerides).  Improves the body's use of the hormone insulin by:  Increasing the body's insulin sensitivity.  Reducing the body's insulin needs.  Decreases the risk for heart disease because exercising:  Lowers cholesterol and triglycerides levels.  Increases the levels of good cholesterol (such as high-density lipoproteins [HDL]) in the body.  Lowers blood glucose levels. YOUR ACTIVITY PLAN  Choose an activity that you enjoy and set realistic goals. Your health care provider or diabetes educator can help you make an activity plan that works for you. You can break activities into 2 or 3 sessions throughout the day. Doing so is as good as one long session. Exercise ideas include:  Taking the  dog for a walk.  Taking the stairs instead of the elevator.  Dancing to your favorite song.  Doing your favorite exercise with a friend. RECOMMENDATIONS FOR EXERCISING WITH TYPE 1 OR TYPE 2 DIABETES   Check your blood glucose before exercising. If blood glucose levels are greater than 240 mg/dL, check for urine ketones. Do not exercise if ketones are present.  Avoid injecting insulin into areas of the body that are going to be exercised. For example, avoid injecting insulin into:  The arms when playing tennis.  The legs when jogging.  Keep a record of:  Food intake before and after you exercise.  Expected peak times of insulin action.  Blood glucose levels before and after you exercise.  The type and amount of exercise you have done.  Review your records with your health care provider. Your health care provider will help you to develop guidelines for adjusting food intake and insulin amounts before and after exercising.  If you take insulin or oral hypoglycemic agents, watch for signs and symptoms of hypoglycemia. They include:  Dizziness.  Shaking.  Sweating.  Chills.  Confusion.  Drink plenty of water while you exercise to prevent dehydration or heat stroke. Body water is lost during exercise and must be replaced.  Talk to your health care provider before starting an exercise program to make sure it is safe for you. Remember, almost any type of activity is better than none. Document Released: 08/13/2003 Document Revised: 01/23/2013 Document Reviewed: 10/30/2012 [employer] Patient Information  2014 [place], [place].

## 2013-04-15 NOTE — Assessment & Plan Note (Signed)
We are starting her on Cymbalta 30 mg for a week then she'll increase to 60 mg.  F/U in one month.

## 2013-04-18 ENCOUNTER — Other Ambulatory Visit: Payer: Self-pay | Admitting: Family Medicine

## 2013-05-20 ENCOUNTER — Ambulatory Visit (INDEPENDENT_AMBULATORY_CARE_PROVIDER_SITE_OTHER): Payer: Medicare Other | Admitting: Family Medicine

## 2013-05-20 ENCOUNTER — Encounter (INDEPENDENT_AMBULATORY_CARE_PROVIDER_SITE_OTHER): Payer: Self-pay

## 2013-05-20 ENCOUNTER — Encounter: Payer: Self-pay | Admitting: Family Medicine

## 2013-05-20 VITALS — BP 131/80 | HR 76 | Temp 97.9°F | Resp 16 | Wt 201.0 lb

## 2013-05-20 DIAGNOSIS — R070 Pain in throat: Secondary | ICD-10-CM

## 2013-05-20 DIAGNOSIS — J019 Acute sinusitis, unspecified: Secondary | ICD-10-CM

## 2013-05-20 DIAGNOSIS — R059 Cough, unspecified: Secondary | ICD-10-CM

## 2013-05-20 DIAGNOSIS — R05 Cough: Secondary | ICD-10-CM

## 2013-05-20 LAB — POCT RAPID STREP A (OFFICE): Rapid Strep A Screen: NEGATIVE

## 2013-05-20 MED ORDER — AZITHROMYCIN 500 MG PO TABS: 500.0000 mg | ORAL_TABLET | Freq: Every day | ORAL | Status: AC

## 2013-05-20 MED ORDER — BENZONATATE 200 MG PO CAPS: 200.0000 mg | ORAL_CAPSULE | Freq: Three times a day (TID) | ORAL | Status: DC | PRN

## 2013-05-20 NOTE — Progress Notes (Signed)
Subjective:    Patient ID: Adrienne Mcconnell, female    DOB: 02-18-1951, 62 y.o.   MRN: [id number]  Adrienne Mcconnell is here today complaining of URI symptoms.    URI  This is a new problem. The current episode started yesterday. The problem has been gradually worsening. There has been no fever. Associated symptoms include congestion, coughing, ear pain, nausea, rhinorrhea and a sore throat. Adrienne Mcconnell has tried nothing for the symptoms.     Review of Systems  HENT: Positive for congestion, ear pain, rhinorrhea and sore throat.   Respiratory: Positive for cough.   Gastrointestinal: Positive for nausea.    Past Medical History  Diagnosis Date  . Hyperlipidemia   . Hypertension   . GERD (gastroesophageal reflux disease)   . Osteoarthritis     Neck  . IBS (irritable bowel syndrome)   . History of migraine headaches   . Diabetes mellitus without complication   . History of colon polyps   . Solitary kidney   . Osteoarthritis   . Candidiasis of skin and nail      Past Surgical History  Procedure Laterality Date  . Abdominal hysterectomy    . Nasal sinus surgery    . Salpingoophorectomy Left      History   Social History Narrative   Marital Status:  Widowed   Children:  Daughter Psychologist, counselling)    Pets: Dogs (7)    Living Situation: Lives alone    Occupation: Disabled   Education:  GED   Tobacco Use/Exposure:  None    Alcohol Use:  None    Drug Use:  None   Diet:  Regular   Exercise:  Walking    Hobbies: Reading, Animals, Photography              Family History  Problem Relation Age of Onset  . Diabetes Mother   . Heart disease Mother   . Diabetes Father   . Stroke Father   . Diabetes Maternal Aunt   . Diabetes Maternal Uncle   . CVA Maternal Uncle   . Diabetes Paternal Aunt   . Diabetes Paternal Uncle   . Heart disease Maternal Grandmother   . Cancer Cousin     Breast and lung cancer     Current Outpatient Prescriptions on File Prior to Visit  Medication Sig Dispense  Refill  . albuterol (PROVENTIL HFA;VENTOLIN HFA) 108 (90 BASE) MCG/ACT inhaler Inhale 2 puffs into the lungs every 6 (six) hours as needed for wheezing.  1 Inhaler  11  . butalbital-acetaminophen-caffeine (FIORICET, ESGIC) 50-325-40 MG per tablet       . Canagliflozin (INVOKANA) 300 MG TABS Take 1 tablet (300 mg total) by mouth daily.  30 tablet  5  . carisoprodol (SOMA) 350 MG tablet       . celecoxib (CELEBREX) 200 MG capsule Take 1 capsule (200 mg total) by mouth daily.  30 capsule  11  . colesevelam (WELCHOL) 625 MG tablet Take 3 tablets (1,875 mg total) by mouth 2 (two) times daily with a meal.  180 tablet  11  . diclofenac sodium (VOLTAREN) 1 % GEL Apply 4 g topically 4 (four) times daily.  5 Tube  11  . DULoxetine (CYMBALTA) 30 MG capsule Take 1 capsule po with supper for 1 week then increase to 2 capsules  60 capsule  0  . ezetimibe (ZETIA) 10 MG tablet Take 1 tablet (10 mg total) by mouth daily.  30 tablet  11  . furosemide (  LASIX) 40 MG tablet TAKE 1 TABLET BY MOUTH EVERY MORNING AS NEEDED FOR INCREASED FLUID  90 tablet  1  . gabapentin (NEURONTIN) 300 MG capsule       . levothyroxine (SYNTHROID, LEVOTHROID) 137 MCG tablet Take 1 tablet (137 mcg total) by mouth daily before breakfast.  30 tablet  11  . LIDODERM 5 % APPLY UP TO 3 PATCHES TO PAINFUL AREAS NO MORE THAN 12 HOURS EACH DAY. (12 HOURS ON, 12 HOURS OFF)  90 patch  3  . losartan (COZAAR) 50 MG tablet Take 1 tablet (50 mg total) by mouth daily.  30 tablet  11  . montelukast (SINGULAIR) 10 MG tablet Take 1 tablet (10 mg total) by mouth at bedtime.  30 tablet  11  . nadolol (CORGARD) 40 MG tablet Take 1/2 tab po daily at bedtime  15 tablet  5  . nateglinide (STARLIX) 120 MG tablet Take 1 tablet (120 mg total) by mouth 3 (three) times daily before meals.  90 tablet  5  . nystatin (MYCOSTATIN/NYSTOP) 100000 UNIT/GM POWD Apply to skin BID  60 g  11  . nystatin cream (MYCOSTATIN) APPLY TO SKIN TWICE DAILY  30 g  3  . pantoprazole  (PROTONIX) 40 MG tablet Take 1 tablet (40 mg total) by mouth daily.  30 tablet  11  . potassium chloride SA (K-DUR,KLOR-CON) 20 MEQ tablet TAKE 1-2 TABLETS BY MOUTH EVERY DAY  90 tablet  1  . pravastatin (PRAVACHOL) 40 MG tablet Take 1 tablet (40 mg total) by mouth at bedtime.  30 tablet  5  . promethazine (PHENERGAN) 25 MG tablet Take 1 tablet (25 mg total) by mouth every 8 (eight) hours as needed for nausea.  60 tablet  1  . sitaGLIPtin (JANUVIA) 100 MG tablet Take 1 tablet (100 mg total) by mouth daily.  30 tablet  5  . topiramate (TOPAMAX) 25 MG tablet       . valACYclovir (VALTREX) 1000 MG tablet Take 1 tablet (1,000 mg total) by mouth daily.  30 tablet  11  . Vitamin D, Ergocalciferol, (DRISDOL) 50000 UNITS CAPS capsule TAKE 1 CAPSULE BY MOUTH TWICE A WEEK  8 capsule  1   No current facility-administered medications on file prior to visit.     Allergies  Allergen Reactions  . Ace Inhibitors Cough  . Dilaudid [Hydromorphone Hcl] Hives  . Sulfur Hives     Immunization History  Administered Date(s) Administered  . Influenza, Seasonal, Injecte, Preservative Fre 01/23/2013  . Pneumococcal-Unspecified 05/17/2007  . Td 06/08/2003  . Tdap 12/17/2009  . Zoster 09/13/2010      Objective:   Physical Exam  Constitutional: Adrienne Mcconnell appears well-nourished. No distress.  HENT:  Head: Normocephalic.  Mouth/Throat: No oropharyngeal exudate.  Eyes: Conjunctivae are normal. Right eye exhibits no discharge. Left eye exhibits no discharge.  Neck: Neck supple.  Cardiovascular: Normal rate, regular rhythm and normal heart sounds.  Exam reveals no gallop and no friction rub.   No murmur heard. Pulmonary/Chest: Effort normal and breath sounds normal. Adrienne Mcconnell has no wheezes. Adrienne Mcconnell exhibits no tenderness.  Lymphadenopathy:    Adrienne Mcconnell has no cervical adenopathy.  Neurological: Adrienne Mcconnell is alert.  Skin: Skin is warm and dry. No rash noted.  Psychiatric: Adrienne Mcconnell has a normal mood and affect.      Assessment & Plan:     Adrienne Mcconnell was seen today for uri.  Diagnoses and associated orders for this visit:  Pain in throat - POCT rapid strep A  Sinusitis, acute - azithromycin (ZITHROMAX) 500 MG tablet; Take 1 tablet (500 mg total) by mouth daily.  Cough - benzonatate (TESSALON) 200 MG capsule; Take 1 capsule (200 mg total) by mouth 3 (three) times daily as needed for cough.

## 2013-05-20 NOTE — Patient Instructions (Addendum)
1)  Head Congestion - Lloyd Huger Med Sinus Rinse (Distilled water plus blue packets); Zyrtec D twice a day  2)  Chest Congestion - Mucinex DM 1200/60 twice a day plus Tessalon Perles 3 x per day; Umcka Cold Care 2 droppers 3-4 x per day.      3)  Head/Chest - Zithromax 1 pill per day for 3 days.      Upper Respiratory Infection, Adult An upper respiratory infection (URI) is also sometimes known as the common cold. The upper respiratory tract includes the nose, sinuses, throat, trachea, and bronchi. Bronchi are the airways leading to the lungs. Most people improve within 1 week, but symptoms can last up to 2 weeks. A residual cough may last even longer.  CAUSES Many different viruses can infect the tissues lining the upper respiratory tract. The tissues become irritated and inflamed and often become very moist. Mucus production is also common. A cold is contagious. You can easily spread the virus to others by oral contact. This includes kissing, sharing a glass, coughing, or sneezing. Touching your mouth or nose and then touching a surface, which is then touched by another person, can also spread the virus. SYMPTOMS  Symptoms typically develop 1 to 3 days after you come in contact with a cold virus. Symptoms vary from person to person. They may include:  Runny nose.  Sneezing.  Nasal congestion.  Sinus irritation.  Sore throat.  Loss of voice (laryngitis).  Cough.  Fatigue.  Muscle aches.  Loss of appetite.  Headache.  Low-grade fever. DIAGNOSIS  You might diagnose your own cold based on familiar symptoms, since most people get a cold 2 to 3 times a year. Your caregiver can confirm this based on your exam. Most importantly, your caregiver can check that your symptoms are not due to another disease such as strep throat, sinusitis, pneumonia, asthma, or epiglottitis. Blood tests, throat tests, and X-rays are not necessary to diagnose a common cold, but they may sometimes be helpful in  excluding other more serious diseases. Your caregiver will decide if any further tests are required. RISKS AND COMPLICATIONS  You may be at risk for a more severe case of the common cold if you smoke cigarettes, have chronic heart disease (such as heart failure) or lung disease (such as asthma), or if you have a weakened immune system. The very young and very old are also at risk for more serious infections. Bacterial sinusitis, middle ear infections, and bacterial pneumonia can complicate the common cold. The common cold can worsen asthma and chronic obstructive pulmonary disease (COPD). Sometimes, these complications can require emergency medical care and may be life-threatening. PREVENTION  The best way to protect against getting a cold is to practice good hygiene. Avoid oral or hand contact with people with cold symptoms. Wash your hands often if contact occurs. There is no clear evidence that vitamin C, vitamin E, echinacea, or exercise reduces the chance of developing a cold. However, it is always recommended to get plenty of rest and practice good nutrition. TREATMENT  Treatment is directed at relieving symptoms. There is no cure. Antibiotics are not effective, because the infection is caused by a virus, not by bacteria. Treatment may include:  Increased fluid intake. Sports drinks offer valuable electrolytes, sugars, and fluids.  Breathing heated mist or steam (vaporizer or shower).  Eating chicken soup or other clear broths, and maintaining good nutrition.  Getting plenty of rest.  Using gargles or lozenges for comfort.  Controlling fevers with  ibuprofen or acetaminophen as directed by your caregiver.  Increasing usage of your inhaler if you have asthma. Zinc gel and zinc lozenges, taken in the first 24 hours of the common cold, can shorten the duration and lessen the severity of symptoms. Pain medicines may help with fever, muscle aches, and throat pain. A variety of non-prescription  medicines are available to treat congestion and runny nose. Your caregiver can make recommendations and may suggest nasal or lung inhalers for other symptoms.  HOME CARE INSTRUCTIONS   Only take over-the-counter or prescription medicines for pain, discomfort, or fever as directed by your caregiver.  Use a warm mist humidifier or inhale steam from a shower to increase air moisture. This may keep secretions moist and make it easier to breathe.  Drink enough water and fluids to keep your urine clear or pale yellow.  Rest as needed.  Return to work when your temperature has returned to normal or as your caregiver advises. You may need to stay home longer to avoid infecting others. You can also use a face mask and careful hand washing to prevent spread of the virus. SEEK MEDICAL CARE IF:   After the first few days, you feel you are getting worse rather than better.  You need your caregiver's advice about medicines to control symptoms.  You develop chills, worsening shortness of breath, or brown or red sputum. These may be signs of pneumonia.  You develop yellow or brown nasal discharge or pain in the face, especially when you bend forward. These may be signs of sinusitis.  You develop a fever, swollen neck glands, pain with swallowing, or white areas in the back of your throat. These may be signs of strep throat. SEEK IMMEDIATE MEDICAL CARE IF:   You have a fever.  You develop severe or persistent headache, ear pain, sinus pain, or chest pain.  You develop wheezing, a prolonged cough, cough up blood, or have a change in your usual mucus (if you have chronic lung disease).  You develop sore muscles or a stiff neck. Document Released: 11/16/2000 Document Revised: 08/15/2011 Document Reviewed: 09/24/2010 [facility] Patient Information 2014 [place], [place].

## 2013-05-28 ENCOUNTER — Other Ambulatory Visit: Payer: Self-pay | Admitting: Family Medicine

## 2013-05-29 ENCOUNTER — Other Ambulatory Visit: Payer: Self-pay | Admitting: Family Medicine

## 2013-06-24 ENCOUNTER — Other Ambulatory Visit: Payer: Self-pay | Admitting: Family Medicine

## 2013-06-27 ENCOUNTER — Other Ambulatory Visit: Payer: Self-pay | Admitting: Family Medicine

## 2013-07-31 ENCOUNTER — Other Ambulatory Visit: Payer: Self-pay | Admitting: Family Medicine

## 2013-08-06 ENCOUNTER — Other Ambulatory Visit: Payer: Self-pay | Admitting: *Deleted

## 2013-08-06 DIAGNOSIS — I10 Essential (primary) hypertension: Secondary | ICD-10-CM

## 2013-08-06 DIAGNOSIS — E038 Other specified hypothyroidism: Secondary | ICD-10-CM

## 2013-08-06 DIAGNOSIS — E119 Type 2 diabetes mellitus without complications: Secondary | ICD-10-CM

## 2013-08-06 DIAGNOSIS — E785 Hyperlipidemia, unspecified: Secondary | ICD-10-CM

## 2013-08-07 ENCOUNTER — Other Ambulatory Visit: Payer: Medicare Other

## 2013-08-07 LAB — COMPLETE METABOLIC PANEL WITH GFR
ALT: 18 U/L (ref 0–35)
AST: 16 U/L (ref 0–37)
Albumin: 4.4 g/dL (ref 3.5–5.2)
Alkaline Phosphatase: 93 U/L (ref 39–117)
BUN: 18 mg/dL (ref 6–23)
CO2: 27 mEq/L (ref 19–32)
Calcium: 10.1 mg/dL (ref 8.4–10.5)
Chloride: 104 mEq/L (ref 96–112)
Creat: 0.85 mg/dL (ref 0.50–1.10)
GFR, Est African American: 86 mL/min
GFR, Est Non African American: 75 mL/min
Glucose, Bld: 126 mg/dL — ABNORMAL HIGH (ref 70–99)
Potassium: 5.4 mEq/L — ABNORMAL HIGH (ref 3.5–5.3)
Sodium: 143 mEq/L (ref 135–145)
Total Bilirubin: 0.4 mg/dL (ref 0.2–1.2)
Total Protein: 7.1 g/dL (ref 6.0–8.3)

## 2013-08-07 LAB — CBC WITH DIFFERENTIAL/PLATELET
Basophils Absolute: 0 10*3/uL (ref 0.0–0.1)
Basophils Relative: 0 % (ref 0–1)
Eosinophils Absolute: 0.1 10*3/uL (ref 0.0–0.7)
Eosinophils Relative: 2 % (ref 0–5)
HCT: 40.9 % (ref 36.0–46.0)
Hemoglobin: 14.3 g/dL (ref 12.0–15.0)
Lymphocytes Relative: 33 % (ref 12–46)
Lymphs Abs: 2.4 10*3/uL (ref 0.7–4.0)
MCH: 33.3 pg (ref 26.0–34.0)
MCHC: 35 g/dL (ref 30.0–36.0)
MCV: 95.1 fL (ref 78.0–100.0)
Monocytes Absolute: 0.6 10*3/uL (ref 0.1–1.0)
Monocytes Relative: 8 % (ref 3–12)
Neutro Abs: 4.1 10*3/uL (ref 1.7–7.7)
Neutrophils Relative %: 57 % (ref 43–77)
Platelets: 268 10*3/uL (ref 150–400)
RBC: 4.3 MIL/uL (ref 3.87–5.11)
RDW: 13.2 % (ref 11.5–15.5)
WBC: 7.2 10*3/uL (ref 4.0–10.5)

## 2013-08-07 LAB — HEMOGLOBIN A1C
Hgb A1c MFr Bld: 6.5 % — ABNORMAL HIGH (ref <5.7)
Mean Plasma Glucose: 140 mg/dL — ABNORMAL HIGH (ref <117)

## 2013-08-08 LAB — T4, FREE: Free T4: 1.12 ng/dL (ref 0.80–1.80)

## 2013-08-08 LAB — TSH: TSH: 0.153 u[IU]/mL — ABNORMAL LOW (ref 0.350–4.500)

## 2013-08-27 ENCOUNTER — Encounter: Payer: Self-pay | Admitting: Family Medicine

## 2013-08-27 ENCOUNTER — Other Ambulatory Visit: Payer: Self-pay | Admitting: Family Medicine

## 2013-08-27 ENCOUNTER — Ambulatory Visit (INDEPENDENT_AMBULATORY_CARE_PROVIDER_SITE_OTHER): Payer: Medicare Other | Admitting: Family Medicine

## 2013-08-27 VITALS — BP 142/83 | HR 62 | Resp 16 | Wt 202.0 lb

## 2013-08-27 DIAGNOSIS — E669 Obesity, unspecified: Secondary | ICD-10-CM

## 2013-08-27 DIAGNOSIS — R110 Nausea: Secondary | ICD-10-CM

## 2013-08-27 DIAGNOSIS — E039 Hypothyroidism, unspecified: Secondary | ICD-10-CM

## 2013-08-27 DIAGNOSIS — M7120 Synovial cyst of popliteal space [Baker], unspecified knee: Secondary | ICD-10-CM

## 2013-08-27 DIAGNOSIS — K219 Gastro-esophageal reflux disease without esophagitis: Secondary | ICD-10-CM

## 2013-08-27 MED ORDER — OMEPRAZOLE 20 MG PO CPDR: 20.0000 mg | DELAYED_RELEASE_CAPSULE | Freq: Two times a day (BID) | ORAL | Status: AC

## 2013-08-27 MED ORDER — PROMETHAZINE HCL 25 MG PO TABS: ORAL_TABLET | ORAL | Status: DC

## 2013-08-27 MED ORDER — PHENTERMINE HCL 37.5 MG PO TABS: 37.5000 mg | ORAL_TABLET | Freq: Every day | ORAL | Status: AC

## 2013-08-27 NOTE — Patient Instructions (Addendum)
1)  Vitamin D- 2,000 IU (Winter) 1,000 IU the rest of the year; Calcium 1200 mg - 1500 mg per day.  (Citracal Slow Release 1200)   2)  Thyroid Medication - We will try a new one to see if it will give you more energy.  ([facility] does not carry Nature-Thyroid).    3)  Weight - Take Phentermine daily; recheck weight in one month.    Exercise to Lose Weight Exercise and a healthy diet may help you lose weight. Your doctor may suggest specific exercises. EXERCISE IDEAS AND TIPS  Choose low-cost things you enjoy doing, such as walking, bicycling, or exercising to workout videos.  Take stairs instead of the elevator.  Walk during your lunch break.  Park your car further away from work or school.  Go to a gym or an exercise class.  Start with 5 to 10 minutes of exercise each day. Build up to 30 minutes of exercise 4 to 6 days a week.  Wear shoes with good support and comfortable clothes.  Stretch before and after working out.  Work out until you breathe harder and your heart beats faster.  Drink extra water when you exercise.  Do not do so much that you hurt yourself, feel dizzy, or get very short of breath. Exercises that burn about 150 calories:  Running 1  miles in 15 minutes.  Playing volleyball for 45 to 60 minutes.  Washing and waxing a car for 45 to 60 minutes.  Playing touch football for 45 minutes.  Walking 1  miles in 35 minutes.  Pushing a stroller 1  miles in 30 minutes.  Playing basketball for 30 minutes.  Raking leaves for 30 minutes.  Bicycling 5 miles in 30 minutes.  Walking 2 miles in 30 minutes.  Dancing for 30 minutes.  Shoveling snow for 15 minutes.  Swimming laps for 20 minutes.  Walking up stairs for 15 minutes.  Bicycling 4 miles in 15 minutes.  Gardening for 30 to 45 minutes.  Jumping rope for 15 minutes.  Washing windows or floors for 45 to 60 minutes. Document Released: 06/25/2010 Document Revised: 08/15/2011 Document  Reviewed: 06/25/2010 [facility] Patient Information 2014 [place].

## 2013-08-27 NOTE — Progress Notes (Signed)
Subjective:    Patient ID: [place] NationDonna M Mcconnell, female    DOB: 09/23/1950, 63 y.o.   MRN: [id number]  HPI  Adrienne Mcconnell is here today to go over her most recent lab results.  She needs some of her medications refilled.  She also wants to discuss the condition listed below:   1)  Leg Swelling - She has noted a collection of fluid in the back of her left leg behind her knee.  She has had this problem for two months.  She has applied Voltaren Gel and Lidoderm patches which have relieved some of the pain.    2)  Weight Gain - She continues to struggle with her weight and low energy.  3)  Colonoscopy - She is past due for this and needs to be set up with a gastroenterologist.   4)  Nausea - She continues to have chronic nausea and wants to have her Phenergan refilled.     Review of Systems  Constitutional: Positive for fatigue and unexpected weight change. Negative for activity change and appetite change.  HENT: Negative.   Eyes: Negative.   Respiratory: Negative for chest tightness and shortness of breath.   Cardiovascular: Negative for chest pain, palpitations and leg swelling.  Gastrointestinal: Positive for nausea. Negative for diarrhea, constipation and blood in stool.  Endocrine: Negative.  Negative for polydipsia, polyphagia and polyuria.  Genitourinary: Negative for urgency, frequency and difficulty urinating.  Musculoskeletal: Negative.        Swelling in the back of her left leg.   Skin: Negative.   Neurological: Negative.  Negative for light-headedness and numbness.  Hematological: Negative for adenopathy. Does not bruise/bleed easily.  Psychiatric/Behavioral: Negative for sleep disturbance and dysphoric mood. The patient is not nervous/anxious.        Objective:   Physical Exam  Vitals reviewed. Constitutional: She is oriented to person, place, and time.  Eyes: Conjunctivae are normal. No scleral icterus.  Neck: Neck supple. No thyromegaly present.  Cardiovascular: Normal rate,  regular rhythm and normal heart sounds.   Pulmonary/Chest: Effort normal and breath sounds normal.  Musculoskeletal: She exhibits edema and tenderness.  ? Baker's Cyst   Lymphadenopathy:    She has no cervical adenopathy.  Neurological: She is alert and oriented to person, place, and time.  Skin: Skin is warm and dry.  Psychiatric: She has a normal mood and affect. Her behavior is normal. Judgment and thought content normal.      Assessment & Plan:    Adrienne Mcconnell was seen today for medication management.  Diagnoses and associated orders for this visit:  Nausea alone Comments: She was given a refill for Phenergan.   I am sending her to [facility] for them to do both an EGD and a colonoscopy.   - promethazine (PHENERGAN) 25 MG tablet; Take up to 2 tab per day as needed for increased nausea  GERD (gastroesophageal reflux disease) - omeprazole (PRILOSEC) 20 MG capsule; Take 1 capsule (20 mg total) by mouth 2 (two) times daily before a meal.  Obesity, unspecified Comments: She is to try Phentermine for one month and return for a recheck of her weight.   - phentermine (ADIPEX-P) 37.5 MG tablet; Take 1 tablet (37.5 mg total) by mouth daily before breakfast.  Baker's cyst of knee Comments: She is a patient of [facility]. She is to contact them to be seen for her knee.     TIME SPENT "FACE TO FACE" WITH PATIENT -  30 MINS

## 2013-08-29 ENCOUNTER — Telehealth: Payer: Self-pay | Admitting: *Deleted

## 2013-08-29 LAB — HM MAMMOGRAPHY

## 2013-08-29 NOTE — Telephone Encounter (Signed)
Adrienne Mcconnell is aware of her appointment [facility] with Dr. Dorna Leitzami Badreddine for an initial consultation and future colonoscopy and EGD (mailed appt info). PG

## 2013-08-30 ENCOUNTER — Other Ambulatory Visit: Payer: Self-pay | Admitting: Family Medicine

## 2013-08-30 ENCOUNTER — Encounter: Payer: Self-pay | Admitting: *Deleted

## 2013-08-30 ENCOUNTER — Telehealth: Payer: Self-pay | Admitting: *Deleted

## 2013-08-30 NOTE — Telephone Encounter (Signed)
Lupita LeashDonna is aware that she has an appointment with Dr Thamas JaegersLennon @ [facility] on 09/30/13 @ 10:40-eh

## 2013-09-04 DIAGNOSIS — E669 Obesity, unspecified: Secondary | ICD-10-CM | POA: Insufficient documentation

## 2013-09-04 DIAGNOSIS — M7120 Synovial cyst of popliteal space [Baker], unspecified knee: Secondary | ICD-10-CM | POA: Insufficient documentation

## 2013-09-10 MED ORDER — LEVOTHYROXINE SODIUM 137 MCG PO TABS: 137.0000 ug | ORAL_TABLET | Freq: Every day | ORAL | Status: AC

## 2013-09-10 NOTE — Addendum Note (Signed)
Addended by: Clint BolderGIL, Gerritt Galentine D on: 09/10/2013 12:28 PM   Modules accepted: Orders

## 2013-09-24 IMAGING — CR DG CHEST 2V
2 series · 2 of 2 positions shown · non-contrast
Comparison: None.

CLINICAL DATA: Cough and fever

CHEST - 2 VIEW

[w chest pa]
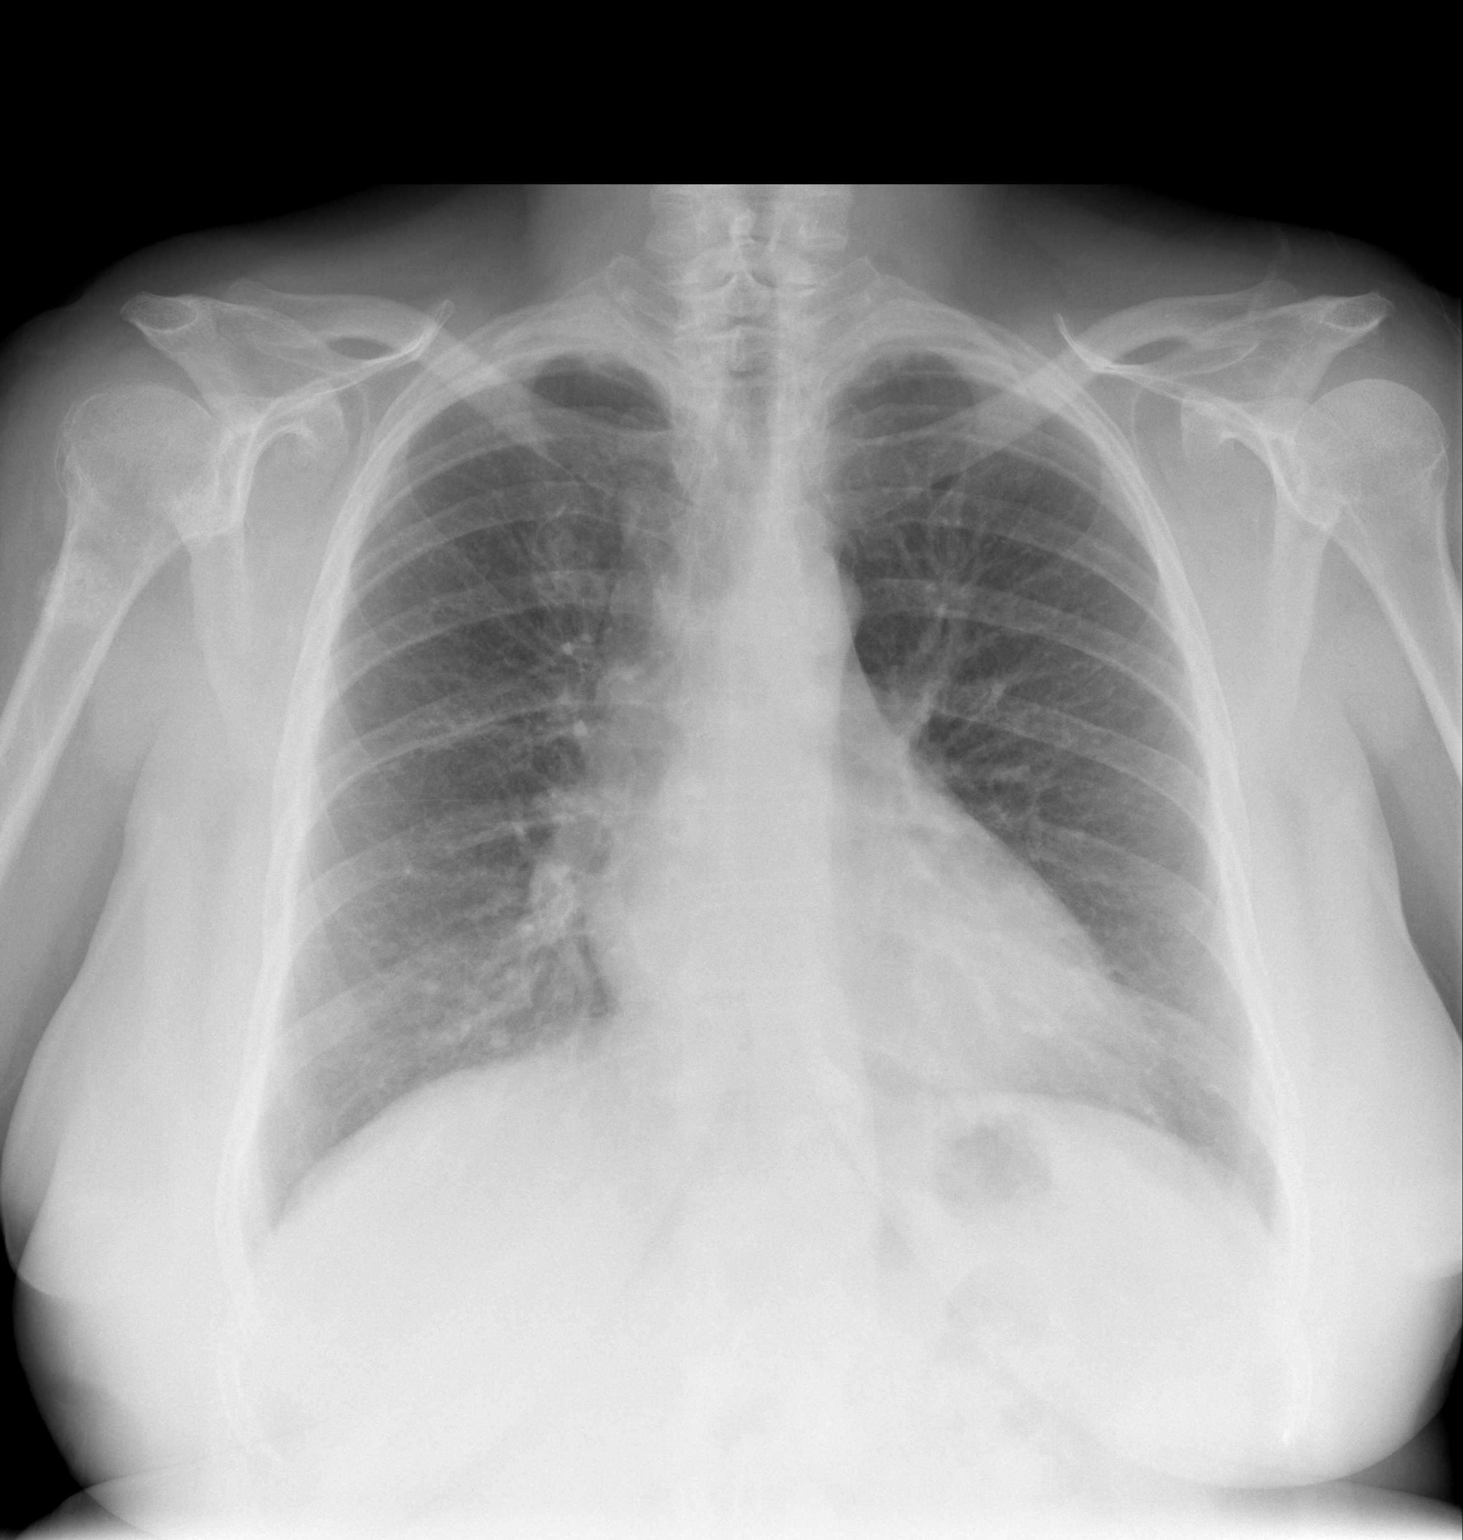

[w chest lat]
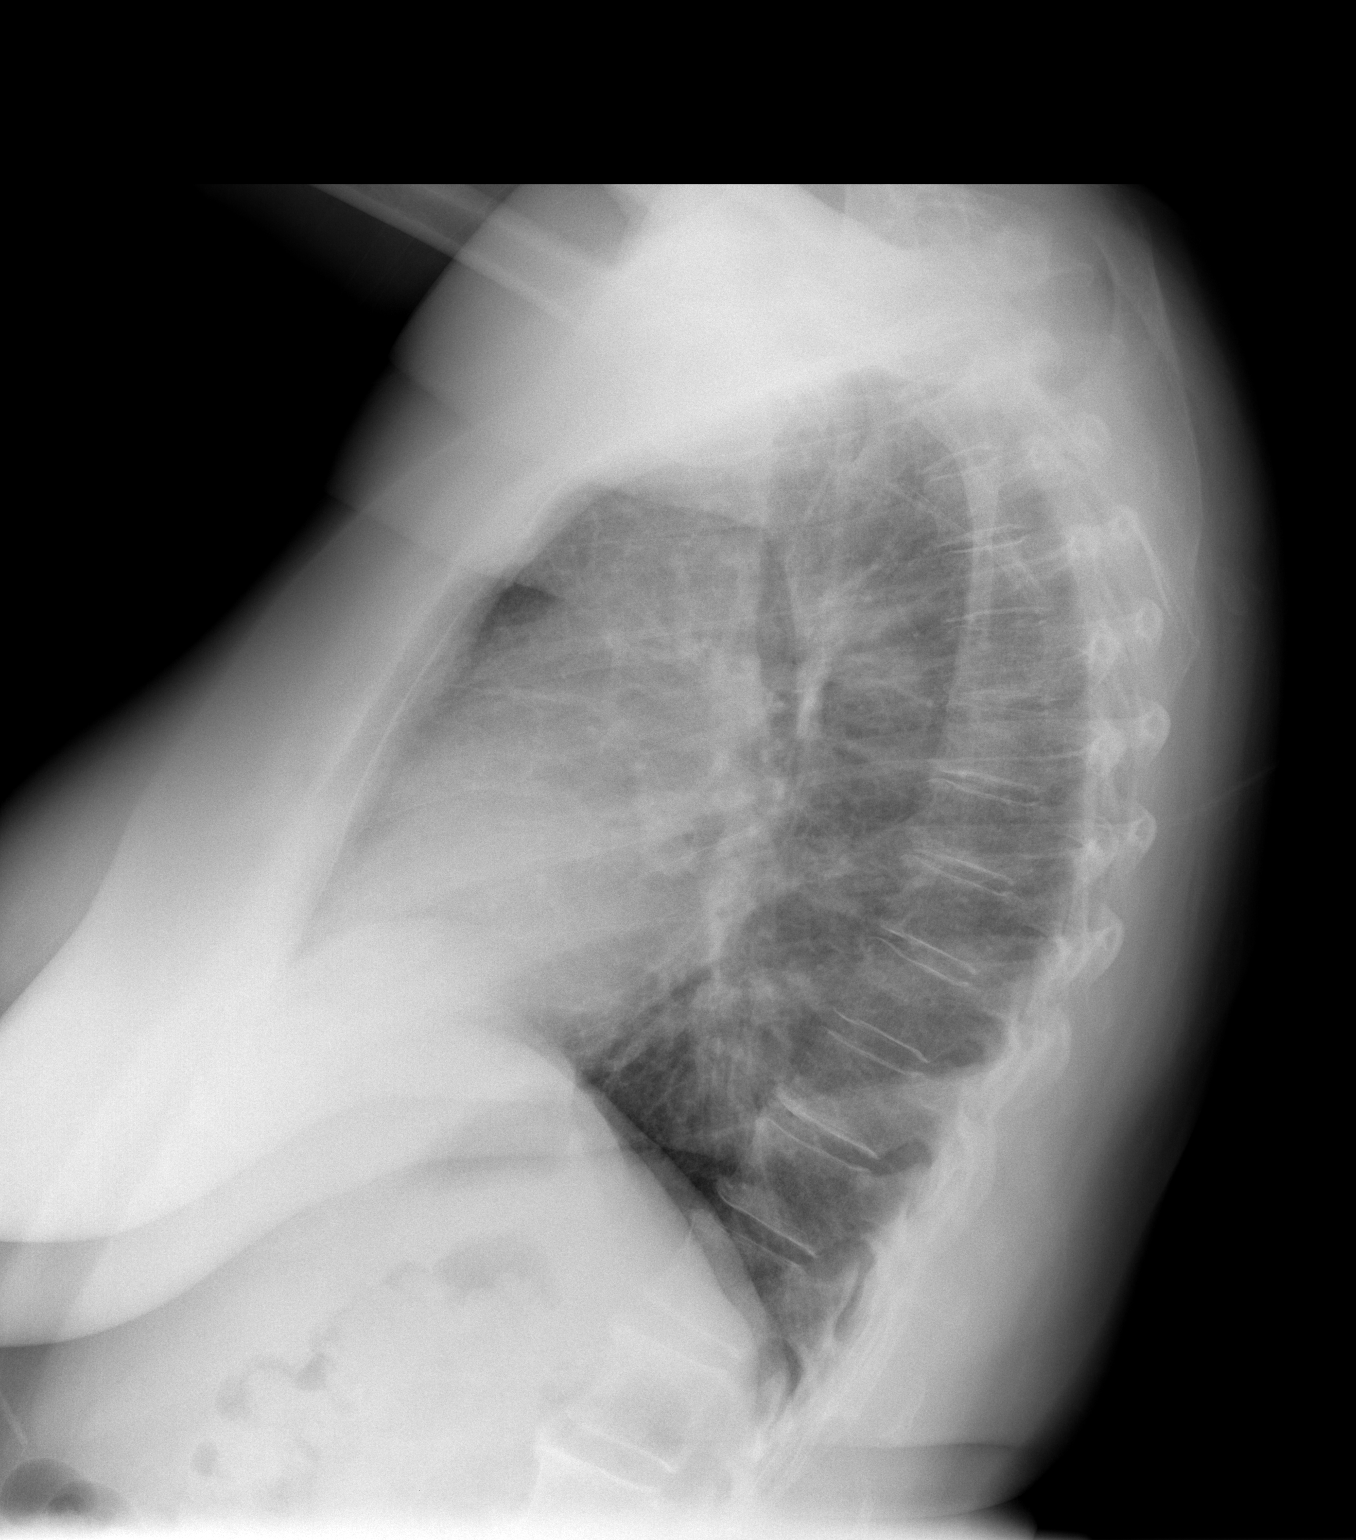

[2 of 2 positions shown; findings below may reference images not displayed]

FINDINGS: ] Lungs clear.  Heart size and pulmonary vascularity are
normal.  No adenopathy.  No bone lesions.

There is soft tissue calcification which appears to overlie the
proximal right humerus of uncertain etiology.
IMPRESSION: Calcification which appears to overlie the proximal right humerus.
Etiology of this calcification is uncertain.  It may be reasonable
to correlate with right humerus radiographs to further assess.

Lungs clear.

## 2013-09-25 ENCOUNTER — Other Ambulatory Visit: Payer: Self-pay | Admitting: Family Medicine

## 2013-09-27 ENCOUNTER — Ambulatory Visit: Payer: Medicare Other | Admitting: Family Medicine

## 2013-09-27 ENCOUNTER — Other Ambulatory Visit: Payer: Self-pay | Admitting: Family Medicine

## 2013-10-11 ENCOUNTER — Ambulatory Visit: Payer: Medicare Other | Admitting: Family Medicine

## 2013-10-22 ENCOUNTER — Other Ambulatory Visit: Payer: Self-pay | Admitting: Family Medicine

## 2013-10-25 ENCOUNTER — Other Ambulatory Visit: Payer: Self-pay | Admitting: Family Medicine

## 2013-11-19 ENCOUNTER — Other Ambulatory Visit: Payer: Self-pay | Admitting: Family Medicine

## 2013-12-17 ENCOUNTER — Other Ambulatory Visit: Payer: Self-pay | Admitting: Family Medicine

## 2013-12-19 NOTE — Telephone Encounter (Signed)
Pharmacy called to check on her refills, said she had a list. Let me know if I need to set up an appointment.

## 2013-12-23 ENCOUNTER — Ambulatory Visit (INDEPENDENT_AMBULATORY_CARE_PROVIDER_SITE_OTHER): Payer: Medicare Other | Admitting: Family Medicine

## 2013-12-23 ENCOUNTER — Encounter: Payer: Self-pay | Admitting: Family Medicine

## 2013-12-23 ENCOUNTER — Other Ambulatory Visit: Payer: Self-pay | Admitting: Family Medicine

## 2013-12-23 VITALS — BP 123/76 | HR 61 | Resp 16 | Ht 62.0 in | Wt 209.0 lb

## 2013-12-23 DIAGNOSIS — IMO0001 Reserved for inherently not codable concepts without codable children: Secondary | ICD-10-CM

## 2013-12-23 DIAGNOSIS — L309 Dermatitis, unspecified: Secondary | ICD-10-CM

## 2013-12-23 DIAGNOSIS — R110 Nausea: Secondary | ICD-10-CM

## 2013-12-23 DIAGNOSIS — E1165 Type 2 diabetes mellitus with hyperglycemia: Secondary | ICD-10-CM

## 2013-12-23 DIAGNOSIS — E785 Hyperlipidemia, unspecified: Secondary | ICD-10-CM

## 2013-12-23 DIAGNOSIS — B372 Candidiasis of skin and nail: Secondary | ICD-10-CM

## 2013-12-23 DIAGNOSIS — J45909 Unspecified asthma, uncomplicated: Secondary | ICD-10-CM

## 2013-12-23 DIAGNOSIS — Z5181 Encounter for therapeutic drug level monitoring: Secondary | ICD-10-CM

## 2013-12-23 DIAGNOSIS — M159 Polyosteoarthritis, unspecified: Secondary | ICD-10-CM

## 2013-12-23 DIAGNOSIS — E559 Vitamin D deficiency, unspecified: Secondary | ICD-10-CM

## 2013-12-23 DIAGNOSIS — R5381 Other malaise: Secondary | ICD-10-CM

## 2013-12-23 DIAGNOSIS — M150 Primary generalized (osteo)arthritis: Secondary | ICD-10-CM

## 2013-12-23 DIAGNOSIS — R5383 Other fatigue: Secondary | ICD-10-CM

## 2013-12-23 DIAGNOSIS — E039 Hypothyroidism, unspecified: Secondary | ICD-10-CM

## 2013-12-23 DIAGNOSIS — J4520 Mild intermittent asthma, uncomplicated: Secondary | ICD-10-CM

## 2013-12-23 DIAGNOSIS — L259 Unspecified contact dermatitis, unspecified cause: Secondary | ICD-10-CM

## 2013-12-23 DIAGNOSIS — B009 Herpesviral infection, unspecified: Secondary | ICD-10-CM

## 2013-12-23 DIAGNOSIS — E876 Hypokalemia: Secondary | ICD-10-CM

## 2013-12-23 DIAGNOSIS — I10 Essential (primary) hypertension: Secondary | ICD-10-CM

## 2013-12-23 LAB — CBC WITH DIFFERENTIAL/PLATELET
Basophils Absolute: 0 10*3/uL (ref 0.0–0.1)
Basophils Relative: 0 % (ref 0–1)
Eosinophils Absolute: 0.1 10*3/uL (ref 0.0–0.7)
Eosinophils Relative: 2 % (ref 0–5)
HCT: 36.6 % (ref 36.0–46.0)
Hemoglobin: 12.8 g/dL (ref 12.0–15.0)
Lymphocytes Relative: 42 % (ref 12–46)
Lymphs Abs: 2.6 10*3/uL (ref 0.7–4.0)
MCH: 33.8 pg (ref 26.0–34.0)
MCHC: 35 g/dL (ref 30.0–36.0)
MCV: 96.6 fL (ref 78.0–100.0)
Monocytes Absolute: 0.5 10*3/uL (ref 0.1–1.0)
Monocytes Relative: 8 % (ref 3–12)
Neutro Abs: 2.9 10*3/uL (ref 1.7–7.7)
Neutrophils Relative %: 48 % (ref 43–77)
Platelets: 283 10*3/uL (ref 150–400)
RBC: 3.79 MIL/uL — ABNORMAL LOW (ref 3.87–5.11)
RDW: 13.6 % (ref 11.5–15.5)
WBC: 6.1 10*3/uL (ref 4.0–10.5)

## 2013-12-23 LAB — HEMOGLOBIN A1C
Hgb A1c MFr Bld: 6.3 % — ABNORMAL HIGH (ref <5.7)
Mean Plasma Glucose: 134 mg/dL — ABNORMAL HIGH (ref <117)

## 2013-12-23 MED ORDER — CELECOXIB 200 MG PO CAPS: 200.0000 mg | ORAL_CAPSULE | Freq: Every day | ORAL | Status: AC

## 2013-12-23 MED ORDER — LIDOCAINE 5 % EX PTCH: MEDICATED_PATCH | CUTANEOUS | Status: AC

## 2013-12-23 MED ORDER — NADOLOL 40 MG PO TABS: ORAL_TABLET | ORAL | Status: AC

## 2013-12-23 MED ORDER — NYSTATIN 100000 UNIT/GM EX CREA: TOPICAL_CREAM | Freq: Two times a day (BID) | CUTANEOUS | Status: AC

## 2013-12-23 MED ORDER — NYSTATIN 100000 UNIT/GM EX POWD: CUTANEOUS | Status: AC

## 2013-12-23 MED ORDER — PROMETHAZINE HCL 25 MG PO TABS: ORAL_TABLET | ORAL | Status: AC

## 2013-12-23 MED ORDER — ALBUTEROL SULFATE HFA 108 (90 BASE) MCG/ACT IN AERS: 2.0000 | INHALATION_SPRAY | Freq: Four times a day (QID) | RESPIRATORY_TRACT | Status: AC | PRN

## 2013-12-23 MED ORDER — COLESEVELAM HCL 625 MG PO TABS: 1875.0000 mg | ORAL_TABLET | Freq: Two times a day (BID) | ORAL | Status: AC

## 2013-12-23 MED ORDER — VALACYCLOVIR HCL 1 G PO TABS: 1000.0000 mg | ORAL_TABLET | Freq: Every day | ORAL | Status: AC

## 2013-12-23 MED ORDER — SITAGLIPTIN PHOSPHATE 100 MG PO TABS: 100.0000 mg | ORAL_TABLET | Freq: Every day | ORAL | Status: AC

## 2013-12-23 MED ORDER — POTASSIUM CHLORIDE CRYS ER 20 MEQ PO TBCR: EXTENDED_RELEASE_TABLET | ORAL | Status: AC

## 2013-12-23 MED ORDER — DICLOFENAC SODIUM 1 % TD GEL: 4.0000 g | Freq: Four times a day (QID) | TRANSDERMAL | Status: AC

## 2013-12-23 MED ORDER — TRIAMCINOLONE ACETONIDE 0.1 % EX CREA: 1.0000 "application " | TOPICAL_CREAM | Freq: Two times a day (BID) | CUTANEOUS | Status: AC

## 2013-12-23 MED ORDER — ALBUTEROL SULFATE HFA 108 (90 BASE) MCG/ACT IN AERS: 2.0000 | INHALATION_SPRAY | Freq: Four times a day (QID) | RESPIRATORY_TRACT | Status: DC | PRN

## 2013-12-23 MED ORDER — PRAVASTATIN SODIUM 40 MG PO TABS: 40.0000 mg | ORAL_TABLET | Freq: Every day | ORAL | Status: AC

## 2013-12-23 MED ORDER — EZETIMIBE 10 MG PO TABS: 10.0000 mg | ORAL_TABLET | Freq: Every day | ORAL | Status: AC

## 2013-12-23 MED ORDER — CANAGLIFLOZIN 300 MG PO TABS: 1.0000 | ORAL_TABLET | Freq: Every day | ORAL | Status: AC

## 2013-12-23 MED ORDER — MONTELUKAST SODIUM 10 MG PO TABS: 10.0000 mg | ORAL_TABLET | Freq: Every day | ORAL | Status: AC

## 2013-12-23 NOTE — Patient Instructions (Signed)
1)  Meds - Take as directed   2)  Labs - Get set up for My Chart and you can see your results.

## 2013-12-23 NOTE — Progress Notes (Signed)
Subjective:    Patient ID: Adrienne Mcconnell, female    DOB: 07/30/1950, 63 y.o.   MRN: [id number]  HPI  Adrienne Mcconnell is here for to discuss the following conditions and for medication refills:  1)  Type II DM - Her sugars are controlled with Januvia 100 mg and Invokana 300 mg.  She needs to have these refilled.   2)  Hyperlipidemia - She is doing fine on the combination of Pravachol 40 mg, Zetia 10 mg and Welchol 625 mg several times per day.   3)  Chronic Pain - She continues to struggle with chronic pain.  She needs a refill for her Voltaren Gel, Celebrex and Lidoderm Patch.    4)  Asthma - Her symptoms are controlled with Singulair.  She has to use her albuterol very infrequently.    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      Review of Systems  Constitutional: Negative for activity change, fatigue and unexpected weight change.  HENT: Negative.   Eyes: Negative.   Respiratory: Negative for shortness of breath.   Cardiovascular: Negative for chest pain, palpitations and leg swelling.  Gastrointestinal: Negative for diarrhea and constipation.  Endocrine: Negative.   Genitourinary: Negative for difficulty urinating.  Musculoskeletal: Positive for arthralgias and myalgias.  Skin: Negative.   Neurological: Negative.   Hematological: Negative for adenopathy. Does not bruise/bleed easily.  Psychiatric/Behavioral: Negative for sleep disturbance and dysphoric mood. The patient is not nervous/anxious.      Past Medical History  Diagnosis Date  . Hyperlipidemia   . Hypertension   . GERD (gastroesophageal reflux disease)   . Osteoarthritis     Neck (Dr. Hyacinth Meeker)   . IBS (irritable bowel syndrome)   . History of migraine headaches     Dr. Hyacinth Meeker   . Diabetes mellitus without complication   . History of colon polyps   . Solitary kidney   . Osteoarthritis   . Candidiasis of skin and nail      Past Surgical History  Procedure Laterality Date  . Abdominal hysterectomy     . Nasal sinus surgery    . Salpingoophorectomy Left      History   Social History Narrative   Marital Status:  Widowed   Children:  Daughter Psychologist, counselling)    Pets: Dogs (7)    Living Situation: Lives alone    Occupation: Disabled   Education:  GED   Tobacco Use/Exposure:  None    Alcohol Use:  None    Drug Use:  None   Diet:  Regular   Exercise:  Walking    Hobbies: Reading, Animals, Photography              Family History  Problem Relation Age of Onset  . Diabetes Mother   . Heart disease Mother   . Diabetes Father   . Stroke Father   . Diabetes Maternal Aunt   . Diabetes Maternal Uncle   . CVA Maternal Uncle   . Diabetes Paternal Aunt   . Diabetes Paternal Uncle   . Heart disease Maternal Grandmother   . Cancer Cousin     Breast and lung cancer     Current Outpatient Prescriptions on File Prior to Visit  Medication Sig Dispense Refill  . butalbital-acetaminophen-caffeine (FIORICET, ESGIC) 50-325-40 MG per tablet       . carisoprodol (SOMA) 350 MG tablet       . furosemide (LASIX) 40 MG tablet TAKE 1 TABLET  BY MOUTH EVERY MORNING AS NEEDED FOR INCREASED FLUID  90 tablet  1  . gabapentin (NEURONTIN) 300 MG capsule       . levothyroxine (SYNTHROID, LEVOTHROID) 137 MCG tablet Take 1 tablet (137 mcg total) by mouth daily before breakfast.  30 tablet  11  . omeprazole (PRILOSEC) 20 MG capsule Take 1 capsule (20 mg total) by mouth 2 (two) times daily before a meal.  60 capsule  11  . pantoprazole (PROTONIX) 40 MG tablet Take 1 tablet (40 mg total) by mouth daily.  30 tablet  11  . phentermine (ADIPEX-P) 37.5 MG tablet Take 1 tablet (37.5 mg total) by mouth daily before breakfast.  30 tablet  0   No current facility-administered medications on file prior to visit.     Allergies  Allergen Reactions  . Ace Inhibitors Cough  . Dilaudid [Hydromorphone Hcl] Hives  . Sulfur Hives     Immunization History  Administered Date(s) Administered  . Influenza, Seasonal,  Injecte, Preservative Fre 01/23/2013  . Pneumococcal-Unspecified 05/17/2007  . Td 06/08/2003  . Tdap 12/17/2009  . Zoster 09/13/2010       Objective:   Physical Exam  Vitals reviewed. Constitutional: She is oriented to person, place, and time. She appears well-developed and well-nourished. She does not appear ill.  HENT:  Head: Normocephalic and atraumatic. Hair is normal.  Mouth/Throat: Oropharynx is clear and moist.  Eyes: Conjunctivae are normal. No scleral icterus.  Neck: Neck supple. No JVD present. No thyromegaly present.  Cardiovascular: Normal rate and regular rhythm.  Exam reveals no gallop and no friction rub.   No murmur heard. Pulmonary/Chest: Effort normal and breath sounds normal. No respiratory distress. She has no wheezes. She exhibits no tenderness.  Abdominal: Soft. She exhibits no distension and no mass. There is no tenderness.  Musculoskeletal: Normal range of motion. She exhibits no edema and no tenderness.  Lymphadenopathy:    She has no cervical adenopathy.  Neurological: She is alert and oriented to person, place, and time.  Skin: Skin is warm and dry. No rash noted.  Psychiatric: She has a normal mood and affect. Her behavior is normal. Judgment and thought content normal.      Assessment & Plan:    Adrienne Mcconnell was seen today for medication management and medical management of chronic issues.  Diagnoses and associated orders for this visit:  HSV-2 infection - valACYclovir (VALTREX) 1000 MG tablet; Take 1 tablet (1,000 mg total) by mouth daily.  Type II or unspecified type diabetes mellitus without mention of complication, uncontrolled Comments: Her blood sugar is under good control with an A1c of 6.3% which is down from 6.5% at her last visit.   - sitaGLIPtin (JANUVIA) 100 MG tablet; Take 1 tablet (100 mg total) by mouth daily. - Canagliflozin (INVOKANA) 300 MG TABS; Take 1 tablet (300 mg total) by mouth daily. - Hemoglobin A1c - Microalbumin,  urine  Nausea alone Comments: She was given a refill for Phenergan.   I am sending her to [facility] for them to do both an EGD and a colonoscopy.   - promethazine (PHENERGAN) 25 MG tablet; Take up to 2 tab per day as needed for increased nausea  Other and unspecified hyperlipidemia Comments: She still has some lowering to do to get her LDL <100.  She is going to have to work harder on her diet and exercise.   - pravastatin (PRAVACHOL) 40 MG tablet; Take 1 tablet (40 mg total) by mouth at bedtime. - ezetimibe (  ZETIA) 10 MG tablet; Take 1 tablet (10 mg total) by mouth daily. - colesevelam (WELCHOL) 625 MG tablet; Take 3 tablets (1,875 mg total) by mouth 2 (two) times daily with a meal. - Lipid panel  Candidiasis of skin - nystatin cream (MYCOSTATIN); Apply topically 2 (two) times daily. - nystatin (MYCOSTATIN/NYSTOP) 100000 UNIT/GM POWD; Apply to skin BID  Essential hypertension, benign - nadolol (CORGARD) 40 MG tablet; Take 1/2 tab po daily at bedtime  Primary osteoarthritis involving multiple joints - lidocaine (LIDODERM) 5 %; APPLY UP TO 3 PATCHES TO PAINFUL AREAS NO MORE THAN 12 HOURS EACH DAY. (12 HOURS ON, 12 HOURS OFF) - diclofenac sodium (VOLTAREN) 1 % GEL; Apply 4 g topically 4 (four) times daily. - celecoxib (CELEBREX) 200 MG capsule; Take 1 capsule (200 mg total) by mouth daily.  Low blood potassium - potassium chloride SA (K-DUR,KLOR-CON) 20 MEQ tablet; Take 1 tab po when you take the Lasix.  Asthma, chronic, mild intermittent, uncomplicated - montelukast (SINGULAIR) 10 MG tablet; Take 1 tablet (10 mg total) by mouth at bedtime.. - albuterol (PROVENTIL HFA;VENTOLIN HFA) 108 (90 BASE) MCG/ACT inhaler; Inhale 2 puffs into the lungs every 6 (six) hours as needed for wheezing.  Eczema - triamcinolone cream (KENALOG) 0.1 %; Apply 1 application topically 2 (two) times daily.  Other malaise and fatigue - CBC with Differential  Encounter for therapeutic drug monitoring - COMPLETE  METABOLIC PANEL WITH GFR  Vitamin D deficiency - Vit D  25 hydroxy (rtn osteoporosis monitoring) - Vitamin D, Ergocalciferol, (DRISDOL) 50000 UNITS CAPS capsule; Take 1 capsule (50,000 Units total) by mouth every 7 (seven) days.  Unspecified hypothyroidism - TSH - T4, free - T3, free   TIME SPENT "FACE TO FACE" WITH PATIENT -  60 MINS

## 2013-12-24 ENCOUNTER — Encounter: Payer: Self-pay | Admitting: Family Medicine

## 2013-12-24 ENCOUNTER — Other Ambulatory Visit: Payer: Self-pay | Admitting: Family Medicine

## 2013-12-24 LAB — LIPID PANEL
Cholesterol: 236 mg/dL — ABNORMAL HIGH (ref 0–200)
HDL: 80 mg/dL (ref >39)
LDL Cholesterol: 129 mg/dL — ABNORMAL HIGH (ref 0–99)
Total CHOL/HDL Ratio: 3 Ratio
Triglycerides: 133 mg/dL (ref <150)
VLDL: 27 mg/dL (ref 0–40)

## 2013-12-24 LAB — COMPLETE METABOLIC PANEL WITH GFR
ALT: 26 U/L (ref 0–35)
AST: 19 U/L (ref 0–37)
Albumin: 3.9 g/dL (ref 3.5–5.2)
Alkaline Phosphatase: 85 U/L (ref 39–117)
BUN: 14 mg/dL (ref 6–23)
CO2: 23 mEq/L (ref 19–32)
Calcium: 9.3 mg/dL (ref 8.4–10.5)
Chloride: 105 mEq/L (ref 96–112)
Creat: 0.76 mg/dL (ref 0.50–1.10)
GFR, Est African American: >89 mL/min
GFR, Est Non African American: 86 mL/min
Glucose, Bld: 136 mg/dL — ABNORMAL HIGH (ref 70–99)
Potassium: 5 mEq/L (ref 3.5–5.3)
Sodium: 142 mEq/L (ref 135–145)
Total Bilirubin: 0.4 mg/dL (ref 0.2–1.2)
Total Protein: 6.8 g/dL (ref 6.0–8.3)

## 2013-12-24 LAB — T3, FREE: T3, Free: 3.3 pg/mL (ref 2.3–4.2)

## 2013-12-24 LAB — MICROALBUMIN, URINE: Microalb, Ur: 0.93 mg/dL (ref 0.00–1.89)

## 2013-12-24 LAB — T4, FREE: Free T4: 1.2 ng/dL (ref 0.80–1.80)

## 2013-12-24 LAB — VITAMIN D 25 HYDROXY (VIT D DEFICIENCY, FRACTURES): Vit D, 25-Hydroxy: 23 ng/mL — ABNORMAL LOW (ref 30–89)

## 2013-12-24 LAB — TSH: TSH: 0.302 u[IU]/mL — ABNORMAL LOW (ref 0.350–4.500)

## 2013-12-24 MED ORDER — VITAMIN D (ERGOCALCIFEROL) 1.25 MG (50000 UNIT) PO CAPS: 50000.0000 [IU] | ORAL_CAPSULE | ORAL | Status: AC

## 2014-01-24 ENCOUNTER — Other Ambulatory Visit: Payer: Self-pay | Admitting: Family Medicine

## 2015-04-07 DEATH — deceased
